# Patient Record
Sex: Female | Born: 1990 | Race: White | Hispanic: No | Marital: Married | Smoking: Never smoker
Health system: Southern US, Community
[De-identification: ages and names within clinical notes are randomized; demographics above are authoritative.]

## PROBLEM LIST (undated history)

## (undated) ENCOUNTER — Inpatient Hospital Stay: Payer: Self-pay

## (undated) DIAGNOSIS — R7689 Other specified abnormal immunological findings in serum: Secondary | ICD-10-CM

## (undated) DIAGNOSIS — B009 Herpesviral infection, unspecified: Secondary | ICD-10-CM

## (undated) DIAGNOSIS — Z800 Family history of malignant neoplasm of digestive organs: Secondary | ICD-10-CM

## (undated) DIAGNOSIS — Z9889 Other specified postprocedural states: Secondary | ICD-10-CM

## (undated) DIAGNOSIS — T8859XA Other complications of anesthesia, initial encounter: Secondary | ICD-10-CM

## (undated) DIAGNOSIS — D069 Carcinoma in situ of cervix, unspecified: Secondary | ICD-10-CM

## (undated) DIAGNOSIS — U071 COVID-19: Secondary | ICD-10-CM

## (undated) DIAGNOSIS — C801 Malignant (primary) neoplasm, unspecified: Secondary | ICD-10-CM

## (undated) DIAGNOSIS — Z8489 Family history of other specified conditions: Secondary | ICD-10-CM

## (undated) DIAGNOSIS — Z789 Other specified health status: Secondary | ICD-10-CM

## (undated) DIAGNOSIS — R112 Nausea with vomiting, unspecified: Secondary | ICD-10-CM

## (undated) DIAGNOSIS — B259 Cytomegaloviral disease, unspecified: Secondary | ICD-10-CM

## (undated) DIAGNOSIS — T4145XA Adverse effect of unspecified anesthetic, initial encounter: Secondary | ICD-10-CM

## (undated) DIAGNOSIS — R768 Other specified abnormal immunological findings in serum: Secondary | ICD-10-CM

## (undated) HISTORY — DX: Other specified abnormal immunological findings in serum: R76.89

## (undated) HISTORY — PX: TONSILLECTOMY: SUR1361

## (undated) HISTORY — DX: Herpesviral infection, unspecified: B00.9

## (undated) HISTORY — DX: Other specified abnormal immunological findings in serum: R76.8

## (undated) HISTORY — DX: Cytomegaloviral disease, unspecified: B25.9

## (undated) HISTORY — PX: APPENDECTOMY: SHX54

## (undated) HISTORY — DX: Family history of malignant neoplasm of digestive organs: Z80.0

## (undated) HISTORY — PX: ABDOMINAL HYSTERECTOMY: SHX81

## (undated) HISTORY — DX: COVID-19: U07.1

## (undated) HISTORY — DX: Carcinoma in situ of cervix, unspecified: D06.9

## (undated) HISTORY — PX: WISDOM TOOTH EXTRACTION: SHX21

## (undated) HISTORY — DX: Malignant (primary) neoplasm, unspecified: C80.1

---
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
## 2010-05-04 ENCOUNTER — Ambulatory Visit: Payer: Self-pay | Admitting: Internal Medicine

## 2011-05-18 ENCOUNTER — Inpatient Hospital Stay: Payer: Self-pay | Admitting: Obstetrics and Gynecology

## 2011-09-28 IMAGING — US ULTRASOUND LEFT BREAST
1 series · 11 of 11 positions shown · non-contrast
Comparison: none

REASON FOR EXAM: Large Firm Painful Knot Between 12 and 3 o'clock
COMMENTS:

[Series 1: ultrasound left breast · 11 of 11 slices shown]
[im 1/11]
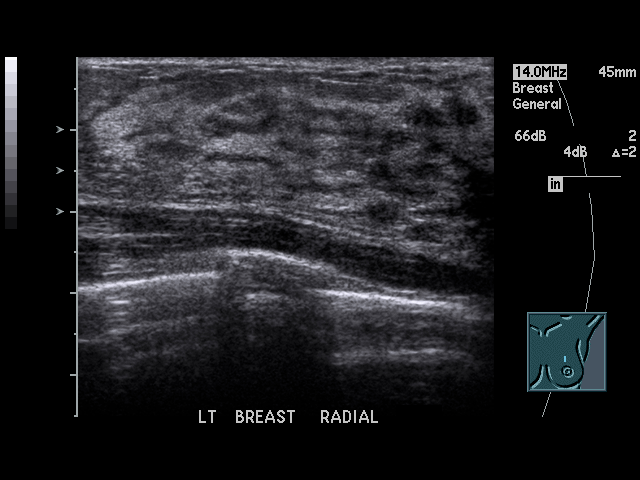
[im 2/11]
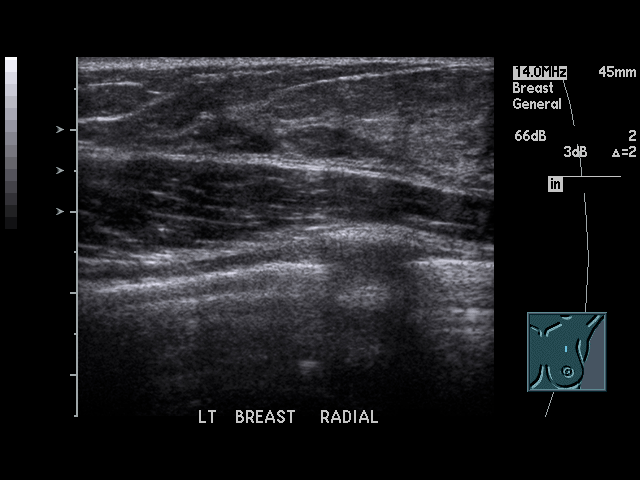
[im 3/11]
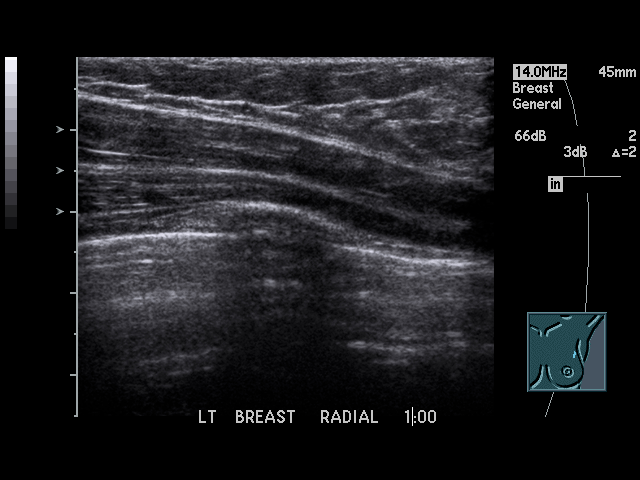
[im 4/11]
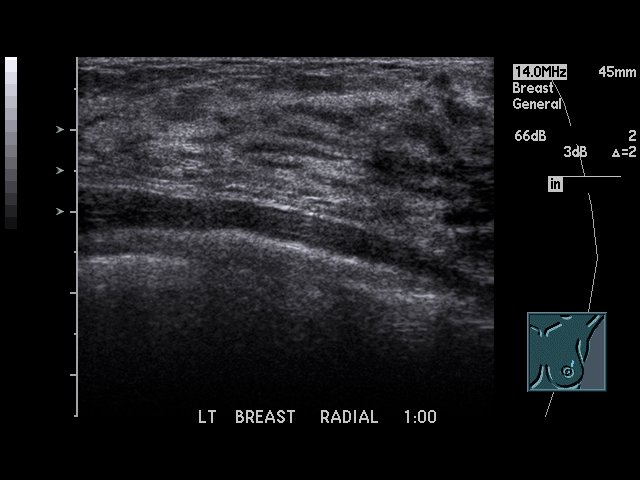
[im 5/11]
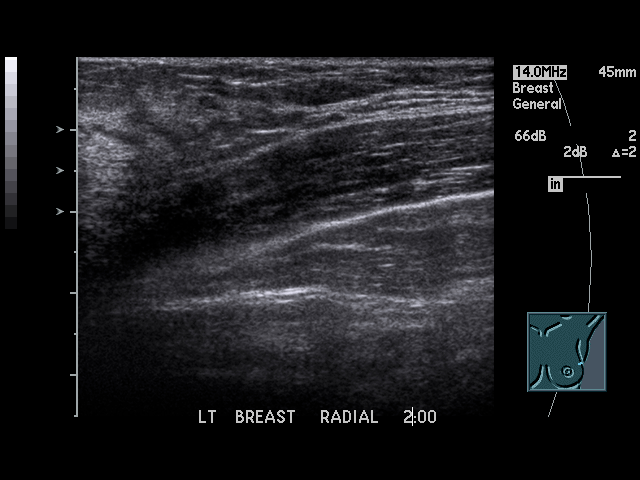
[im 6/11]
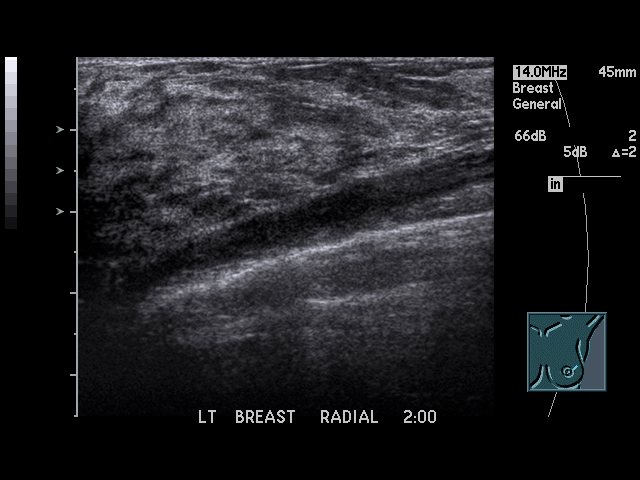
[im 7/11]
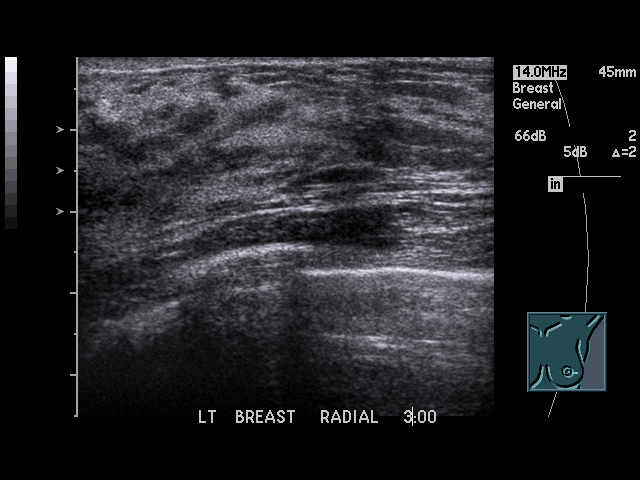
[im 8/11]
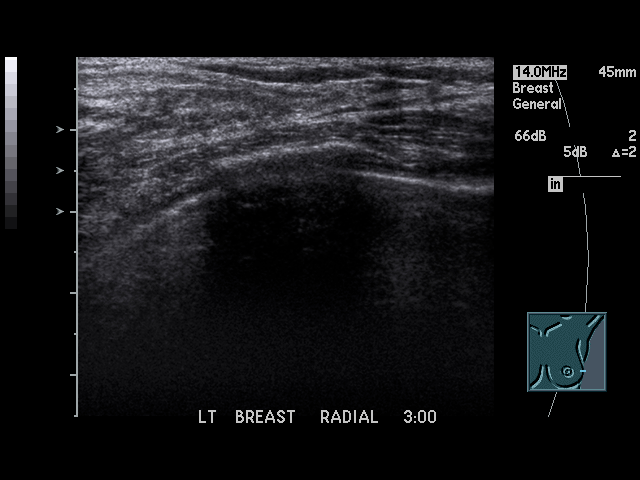
[im 9/11]
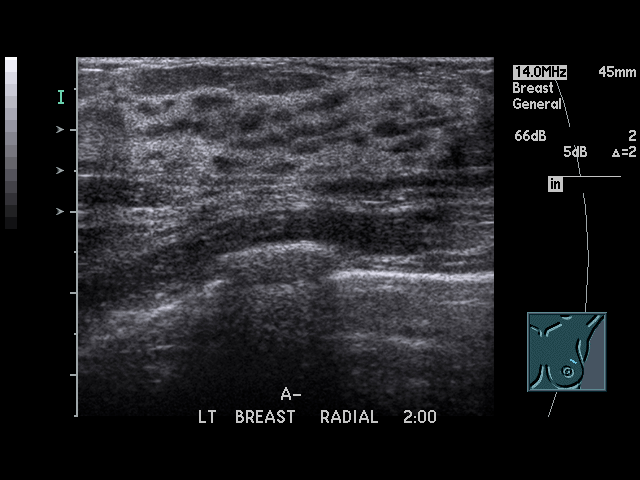
[im 10/11]
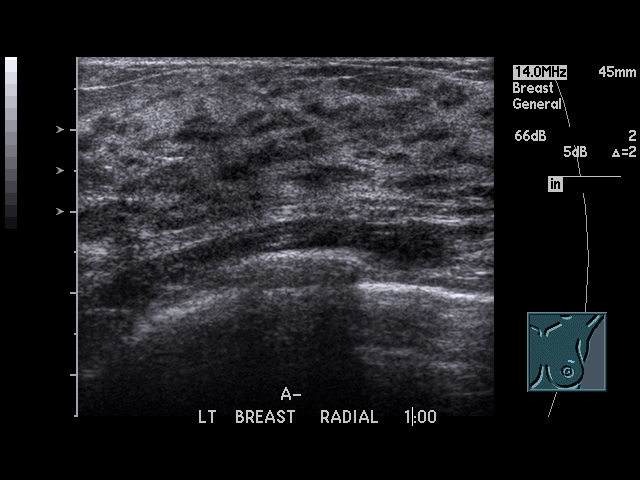
[im 11/11]
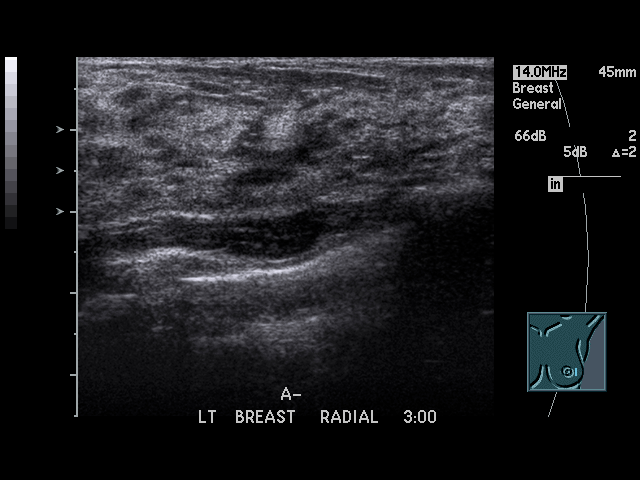

[11 of 11 positions shown; findings below may reference images not displayed]

PROCEDURE:     US  - US BREAST LEFT  - May 04, 2010  [DATE]

RESULT:     The patient has a palpable area of concern in the upper outer
quadrant of the left breast. Sonographic examination targeted to this area
shows no solid or cystic mass lesions. There is no distortion of the breast
parenchyma. Dense fibroglandular tissue is present. No skin thickening is
seen.
IMPRESSION: No mass or other sonographic abnormality is identified.

## 2015-11-06 NOTE — L&D Delivery Note (Signed)
Delivery Note Primary OB: [facility] Delivery Physician: Barnett Applebaum, MD Gestational Age: Full term Antepartum complications: none Intrapartum complications: None  A viable Female was delivered via vertex perentation.  Apgars:8 ,9  Weight:  7 lb 13 oz .   Placenta status: spontaneous and Intact.  Cord: 3+ vessels;  with the following complications: nuchal.  Anesthesia:  epidural Episiotomy:  none Lacerations:  1st Suture Repair: 2.0 vicryl Est. Blood Loss (mL):  less than 100 mL  Mom to postpartum.  Baby to Couplet care / Skin to Skin.  Barnett Applebaum, MD Dept of OB/GYN [phone]

## 2015-12-17 ENCOUNTER — Emergency Department
Admission: EM | Admit: 2015-12-17 | Discharge: 2015-12-17 | Disposition: A | Discharge status: Home / Self Care | Payer: Medicaid Other | Attending: Emergency Medicine | Admitting: Emergency Medicine

## 2015-12-17 ENCOUNTER — Encounter: Payer: Self-pay | Admitting: Emergency Medicine

## 2015-12-17 DIAGNOSIS — Z3A01 Less than 8 weeks gestation of pregnancy: Secondary | ICD-10-CM | POA: Diagnosis not present

## 2015-12-17 DIAGNOSIS — O210 Mild hyperemesis gravidarum: Secondary | ICD-10-CM | POA: Insufficient documentation

## 2015-12-17 DIAGNOSIS — O219 Vomiting of pregnancy, unspecified: Secondary | ICD-10-CM

## 2015-12-17 LAB — CBC WITH DIFFERENTIAL/PLATELET
BASOS ABS: 0 10*3/uL (ref 0–0.1)
Basophils Relative: 0 %
EOS PCT: 1 %
Eosinophils Absolute: 0 10*3/uL (ref 0–0.7)
HCT: 44.1 % (ref 35.0–47.0)
Hemoglobin: 15 g/dL (ref 12.0–16.0)
LYMPHS PCT: 28 %
Lymphs Abs: 2.3 10*3/uL (ref 1.0–3.6)
MCH: 29.4 pg (ref 26.0–34.0)
MCHC: 34.1 g/dL (ref 32.0–36.0)
MCV: 86.3 fL (ref 80.0–100.0)
MONO ABS: 0.6 10*3/uL (ref 0.2–0.9)
MONOS PCT: 8 %
Neutro Abs: 5 10*3/uL (ref 1.4–6.5)
Neutrophils Relative %: 63 %
PLATELETS: 185 10*3/uL (ref 150–440)
RBC: 5.11 MIL/uL (ref 3.80–5.20)
RDW: 13.4 % (ref 11.5–14.5)
WBC: 8 10*3/uL (ref 3.6–11.0)

## 2015-12-17 LAB — COMPREHENSIVE METABOLIC PANEL
ALT: 20 U/L (ref 14–54)
AST: 20 U/L (ref 15–41)
Albumin: 4.6 g/dL (ref 3.5–5.0)
Alkaline Phosphatase: 58 U/L (ref 38–126)
Anion gap: 11 (ref 5–15)
BILIRUBIN TOTAL: 2.3 mg/dL — AB (ref 0.3–1.2)
BUN: 9 mg/dL (ref 6–20)
CALCIUM: 9.7 mg/dL (ref 8.9–10.3)
CO2: 21 mmol/L — ABNORMAL LOW (ref 22–32)
CREATININE: 0.68 mg/dL (ref 0.44–1.00)
Chloride: 106 mmol/L (ref 101–111)
GFR calc Af Amer: >60 mL/min (ref >60)
GLUCOSE: 81 mg/dL (ref 65–99)
POTASSIUM: 3.3 mmol/L — AB (ref 3.5–5.1)
Sodium: 138 mmol/L (ref 135–145)
TOTAL PROTEIN: 7.8 g/dL (ref 6.5–8.1)

## 2015-12-17 MED ORDER — ONDANSETRON HCL 4 MG/2ML IJ SOLN
4.0000 mg | Freq: Once | INTRAMUSCULAR | Status: AC
  Administered 2015-12-17: 4 mg via INTRAVENOUS
  Filled 2015-12-17: qty 2

## 2015-12-17 MED ORDER — ONDANSETRON 4 MG PO TBDP: 4.0000 mg | ORAL_TABLET | Freq: Three times a day (TID) | ORAL | Status: DC | PRN

## 2015-12-17 MED ORDER — SODIUM CHLORIDE 0.9 % IV SOLN
INTRAVENOUS | Status: DC
  Administered 2015-12-17: 11:00:00 via INTRAVENOUS

## 2015-12-17 NOTE — ED Provider Notes (Signed)
[facility] Emergency Department Provider Note  Time seen: 11:49 AM  I have reviewed the triage vital signs and the nursing notes.   HISTORY  Chief Complaint Emesis    HPI Stefanie Gomez is a 25 y.o. female G2 P1, personally [redacted] weeks pregnant who presents the emergency department nausea or vomiting. According to the patient for the past one week she has been nauseated, with vomiting most days. Denies any significant morning sickness with her first pregnancy. Denies any abdominal pain, vaginal bleeding or discharge. States her nausea is minimal at this time.     History reviewed. No pertinent past medical history.  There are no active problems to display for this patient.   Past Surgical History  Procedure Laterality Date  . Appendectomy      No current outpatient prescriptions on file.  Allergies Bactericin  History reviewed. No pertinent family history.  Social History Social History  Substance Use Topics  . Smoking status: Never Smoker   . Smokeless tobacco: None  . Alcohol Use: None    Review of Systems Constitutional: Negative for fever. Cardiovascular: Negative for chest pain. Respiratory: Negative for shortness of breath. Gastrointestinal: Negative for abdominal pain. Positive nausea and vomiting. Negative for diarrhea. Genitourinary: Negative for dysuria. Neurological: Negative for headache 10-point ROS otherwise negative.  ____________________________________________   PHYSICAL EXAM:  VITAL SIGNS: ED Triage Vitals  Enc Vitals Group     BP 12/17/15 0840 140/85 mmHg     Pulse Rate 12/17/15 0840 108     Resp 12/17/15 0840 18     Temp 12/17/15 0840 97.5 F (36.4 C)     Temp Source 12/17/15 0840 Oral     SpO2 12/17/15 0840 100 %     Weight 12/17/15 0840 163 lb (73.936 kg)     Height 12/17/15 0840 5\' 8"  (1.727 m)     Head Cir --      Peak Flow --      Pain Score 12/17/15 0841 1     Pain Loc --      Pain Edu? --    Excl. in [place]? --    Constitutional: Alert and oriented. Well appearing and in no distress. Eyes: Normal exam ENT   Head: Normocephalic and atraumatic   Mouth/Throat: Mucous membranes are moist. Cardiovascular: Normal rate, regular rhythm. No murmur Respiratory: Normal respiratory effort without tachypnea nor retractions. Breath sounds are clear Gastrointestinal: Soft and nontender. No distention.   Musculoskeletal: Nontender with normal range of motion in all extremities.  Neurologic:  Normal speech and language. No gross focal neurologic deficits Skin:  Skin is warm, dry and intact.  Psychiatric: Mood and affect are normal. Speech and behavior are normal.   ____________________________________________   INITIAL IMPRESSION / ASSESSMENT AND PLAN / ED COURSE  Pertinent labs & imaging results that were available during my care of the patient were reviewed by me and considered in my medical decision making (see chart for details).  Patient presents the emergency department nausea and vomiting, [redacted] weeks pregnant. Has her first OB appointment next week. Patient overall appears well. States she is feeling much better after IV fluids. I discussed at length the pros and cons of Zofran, given the possible cleft lip, congenital abnormality defects. They stated they would like to talk it over among themselves, and let me know what they've decided. She states she is feeling better, given her mild dehydration on labs and anion gap of 11 I will dose 1 additional liter of  IV fluids, continue to monitor in the emergency department.  Patient wishes to proceed with Zofran. We will discharge on Zofran ODT. Patient continues to feel well in the emergency department is asking for something to drink.  ____________________________________________   FINAL CLINICAL IMPRESSION(S) / ED DIAGNOSES  Nausea and vomiting of pregnancy   Harvest Dark, MD 12/17/15 1253

## 2015-12-17 NOTE — ED Notes (Signed)
[redacted] wks pregnant, vomiting.

## 2015-12-17 NOTE — Discharge Instructions (Signed)

## 2016-07-07 ENCOUNTER — Observation Stay
Admission: EM | Admit: 2016-07-07 | Discharge: 2016-07-07 | Disposition: A | Discharge status: Home / Self Care | Payer: Medicaid Other | Attending: Obstetrics and Gynecology | Admitting: Obstetrics and Gynecology

## 2016-07-07 DIAGNOSIS — Z3493 Encounter for supervision of normal pregnancy, unspecified, third trimester: Principal | ICD-10-CM | POA: Insufficient documentation

## 2016-07-07 DIAGNOSIS — O4290 Premature rupture of membranes, unspecified as to length of time between rupture and onset of labor, unspecified weeks of gestation: Secondary | ICD-10-CM | POA: Diagnosis present

## 2016-07-07 NOTE — Final Progress Note (Signed)
Physician Final Progress Note  Patient ID: Stefanie Gomez MRN: [id number] DOB/AGE: [birth year] 25 y.o.  Admit date: 07/07/2016 Admitting provider: Malachy Mood, MD Discharge date: 07/07/2016   Admission Diagnoses: Leaking fluid  Discharge Diagnoses:  No evidence of ruptured membranes  Consults: none  Significant Findings/ Diagnostic Studies: negative ferning and nitrazine, reactive NST  Procedures: none  Discharge Condition: stable  Disposition: 01-Home or Self Care  Diet: general  Discharge Activity: no restrictions  Discharge Instructions    Discharge activity:  No Restrictions    Complete by:  As directed   Fetal Kick Count:  Lie on our left side for one hour after a meal, and count the number of times your baby kicks.  If it is less than 5 times, get up, move around and drink some juice.  Repeat the test 30 minutes later.  If it is still less than 5 kicks in an hour, notify your doctor.    Complete by:  As directed   LABOR:  When conractions begin, you should start to time them from the beginning of one contraction to the beginning  of the next.  When contractions are 5 - 10 minutes apart or less and have been regular for at least an hour, you should call your health care provider.    Complete by:  As directed   No sexual activity restrictions    Complete by:  As directed   Notify physician for bleeding from the vagina    Complete by:  As directed   Notify physician for blurring of vision or spots before the eyes    Complete by:  As directed   Notify physician for chills or fever    Complete by:  As directed   Notify physician for fainting spells, "black outs" or loss of consciousness    Complete by:  As directed   Notify physician for increase in vaginal discharge    Complete by:  As directed   Notify physician for leaking of fluid    Complete by:  As directed   Notify physician for pain or burning when urinating    Complete by:  As directed   Notify physician for pelvic  pressure (sudden increase)    Complete by:  As directed   Notify physician for severe or continued nausea or vomiting    Complete by:  As directed   Notify physician for sudden gushing of fluid from the vagina (with or without continued leaking)    Complete by:  As directed   Notify physician for sudden, constant, or occasional abdominal pain    Complete by:  As directed   Notify physician if baby moving less than usual    Complete by:  As directed       Medication List    STOP taking these medications   ondansetron 4 MG disintegrating tablet Commonly known as:  ZOFRAN ODT     TAKE these medications   prenatal multivitamin Tabs tablet Take 1 tablet by mouth daily at 12 noon.        Total time spent taking care of this patient: 10 minutes  Signed: Dorthula Nettles 07/07/2016, 2:58 AM

## 2016-07-07 NOTE — OB Triage Note (Signed)
Ms. Stefanie Gomez came in tonight reporting that her water broke around 12:30am . Reports clear fluid and postive fetal movement. Denies vaginal bleeding.

## 2016-07-07 NOTE — Discharge Instructions (Signed)
Keep prenatal appointment this Wednesday, 07/11/16.

## 2016-08-03 ENCOUNTER — Inpatient Hospital Stay: Payer: Medicaid Other | Admitting: Anesthesiology

## 2016-08-03 ENCOUNTER — Inpatient Hospital Stay
Admission: EM | Admit: 2016-08-03 | Discharge: 2016-08-05 | DRG: 775 | Disposition: A | Discharge status: Home / Self Care | Payer: Medicaid Other | Attending: Obstetrics & Gynecology | Admitting: Obstetrics & Gynecology

## 2016-08-03 DIAGNOSIS — D62 Acute posthemorrhagic anemia: Secondary | ICD-10-CM | POA: Diagnosis not present

## 2016-08-03 DIAGNOSIS — Z3A39 39 weeks gestation of pregnancy: Secondary | ICD-10-CM

## 2016-08-03 DIAGNOSIS — Z3483 Encounter for supervision of other normal pregnancy, third trimester: Secondary | ICD-10-CM | POA: Diagnosis present

## 2016-08-03 DIAGNOSIS — O9081 Anemia of the puerperium: Secondary | ICD-10-CM | POA: Diagnosis not present

## 2016-08-03 LAB — TYPE AND SCREEN
ABO/RH(D): O POS
ANTIBODY SCREEN: NEGATIVE

## 2016-08-03 LAB — CBC
HCT: 36.8 % (ref 35.0–47.0)
HEMOGLOBIN: 12.2 g/dL (ref 12.0–16.0)
MCH: 26.6 pg (ref 26.0–34.0)
MCHC: 33.1 g/dL (ref 32.0–36.0)
MCV: 80.4 fL (ref 80.0–100.0)
Platelets: 160 10*3/uL (ref 150–440)
RBC: 4.57 MIL/uL (ref 3.80–5.20)
RDW: 14.5 % (ref 11.5–14.5)
WBC: 9.4 10*3/uL (ref 3.6–11.0)

## 2016-08-03 MED ORDER — BENZOCAINE-MENTHOL 20-0.5 % EX AERO: 1.0000 "application " | INHALATION_SPRAY | CUTANEOUS | Status: DC | PRN

## 2016-08-03 MED ORDER — LIDOCAINE HCL (PF) 1 % IJ SOLN
INTRAMUSCULAR | Status: AC
  Filled 2016-08-03: qty 30

## 2016-08-03 MED ORDER — FENTANYL 2.5 MCG/ML W/ROPIVACAINE 0.2% IN NS 100 ML EPIDURAL INFUSION (ARMC-ANES)
EPIDURAL | Status: DC | PRN
  Administered 2016-08-03: 10 mL/h via EPIDURAL

## 2016-08-03 MED ORDER — ONDANSETRON HCL 4 MG/2ML IJ SOLN: 4.0000 mg | Freq: Four times a day (QID) | INTRAMUSCULAR | Status: DC | PRN

## 2016-08-03 MED ORDER — SIMETHICONE 80 MG PO CHEW: 80.0000 mg | CHEWABLE_TABLET | ORAL | Status: DC | PRN

## 2016-08-03 MED ORDER — ONDANSETRON HCL 4 MG/2ML IJ SOLN
4.0000 mg | INTRAMUSCULAR | Status: DC | PRN
Start: 2016-08-03 — End: 2016-08-05

## 2016-08-03 MED ORDER — AMMONIA AROMATIC IN INHA
RESPIRATORY_TRACT | Status: AC
  Filled 2016-08-03: qty 10

## 2016-08-03 MED ORDER — BUTORPHANOL TARTRATE 1 MG/ML IJ SOLN
1.0000 mg | INTRAMUSCULAR | Status: DC | PRN
  Administered 2016-08-03 (×2): 1 mg via INTRAVENOUS
  Filled 2016-08-03 (×2): qty 1

## 2016-08-03 MED ORDER — ACETAMINOPHEN 325 MG PO TABS: 650.0000 mg | ORAL_TABLET | ORAL | Status: DC | PRN

## 2016-08-03 MED ORDER — DIPHENHYDRAMINE HCL 25 MG PO CAPS: 25.0000 mg | ORAL_CAPSULE | Freq: Four times a day (QID) | ORAL | Status: DC | PRN

## 2016-08-03 MED ORDER — SODIUM CHLORIDE 0.9% FLUSH: 3.0000 mL | INTRAVENOUS | Status: DC | PRN

## 2016-08-03 MED ORDER — MISOPROSTOL 200 MCG PO TABS
ORAL_TABLET | ORAL | Status: AC
  Filled 2016-08-03: qty 4

## 2016-08-03 MED ORDER — OXYTOCIN 40 UNITS IN LACTATED RINGERS INFUSION - SIMPLE MED
INTRAVENOUS | Status: AC
  Filled 2016-08-03: qty 1000

## 2016-08-03 MED ORDER — OXYCODONE-ACETAMINOPHEN 5-325 MG PO TABS: 1.0000 | ORAL_TABLET | ORAL | Status: DC | PRN

## 2016-08-03 MED ORDER — LIDOCAINE-EPINEPHRINE (PF) 1.5 %-1:200000 IJ SOLN
INTRAMUSCULAR | Status: DC | PRN
  Administered 2016-08-03: 3 mL via PERINEURAL

## 2016-08-03 MED ORDER — SODIUM CHLORIDE 0.9% FLUSH: 3.0000 mL | Freq: Two times a day (BID) | INTRAVENOUS | Status: DC

## 2016-08-03 MED ORDER — SENNOSIDES-DOCUSATE SODIUM 8.6-50 MG PO TABS
2.0000 | ORAL_TABLET | ORAL | Status: DC
  Administered 2016-08-04: 2 via ORAL
  Filled 2016-08-03: qty 2

## 2016-08-03 MED ORDER — WITCH HAZEL-GLYCERIN EX PADS: 1.0000 "application " | MEDICATED_PAD | CUTANEOUS | Status: DC | PRN

## 2016-08-03 MED ORDER — IBUPROFEN 600 MG PO TABS
600.0000 mg | ORAL_TABLET | Freq: Four times a day (QID) | ORAL | Status: DC
  Administered 2016-08-03 – 2016-08-05 (×6): 600 mg via ORAL
  Filled 2016-08-03 (×7): qty 1

## 2016-08-03 MED ORDER — LIDOCAINE HCL (PF) 1 % IJ SOLN
INTRAMUSCULAR | Status: DC | PRN
  Administered 2016-08-03: 3 mL

## 2016-08-03 MED ORDER — LACTATED RINGERS IV SOLN
INTRAVENOUS | Status: DC
  Administered 2016-08-03: 12:00:00 via INTRAVENOUS

## 2016-08-03 MED ORDER — OXYTOCIN 10 UNIT/ML IJ SOLN
INTRAMUSCULAR | Status: AC
  Filled 2016-08-03: qty 2

## 2016-08-03 MED ORDER — ZOLPIDEM TARTRATE 5 MG PO TABS: 5.0000 mg | ORAL_TABLET | Freq: Every evening | ORAL | Status: DC | PRN

## 2016-08-03 MED ORDER — OXYCODONE-ACETAMINOPHEN 5-325 MG PO TABS: 2.0000 | ORAL_TABLET | ORAL | Status: DC | PRN

## 2016-08-03 MED ORDER — FENTANYL 2.5 MCG/ML W/ROPIVACAINE 0.2% IN NS 100 ML EPIDURAL INFUSION (ARMC-ANES)
EPIDURAL | Status: AC
  Filled 2016-08-03: qty 100

## 2016-08-03 MED ORDER — BUPIVACAINE HCL (PF) 0.25 % IJ SOLN
INTRAMUSCULAR | Status: DC | PRN
  Administered 2016-08-03 (×2): 5 mL via EPIDURAL

## 2016-08-03 MED ORDER — LACTATED RINGERS IV SOLN: 500.0000 mL | INTRAVENOUS | Status: DC | PRN

## 2016-08-03 MED ORDER — SODIUM CHLORIDE 0.9 % IV SOLN
250.0000 mL | INTRAVENOUS | Status: DC | PRN
  Administered 2016-08-03: 250 mL via INTRAVENOUS

## 2016-08-03 MED ORDER — OXYTOCIN 40 UNITS IN LACTATED RINGERS INFUSION - SIMPLE MED: 2.5000 [IU]/h | INTRAVENOUS | Status: DC

## 2016-08-03 MED ORDER — ONDANSETRON HCL 4 MG PO TABS: 4.0000 mg | ORAL_TABLET | ORAL | Status: DC | PRN

## 2016-08-03 MED ORDER — DIBUCAINE 1 % RE OINT: 1.0000 "application " | TOPICAL_OINTMENT | RECTAL | Status: DC | PRN

## 2016-08-03 MED ORDER — COCONUT OIL OIL: 1.0000 "application " | TOPICAL_OIL | Status: DC | PRN

## 2016-08-03 MED ORDER — OXYTOCIN BOLUS FROM INFUSION: 500.0000 mL | Freq: Once | INTRAVENOUS | Status: DC

## 2016-08-03 NOTE — H&P (Signed)
Obstetrics Admission History & Physical   CC: Labor contractions   HPI:  25 y.o. G2P1001 @ [redacted]w[redacted]d (08/04/2016, by Last Menstrual Period). Admitted on 08/03/2016: Presents for pain this am worsening, No VB or ROM. Good FM.  No complications this pregnancy.  Pren[facility] care at: at [facility]  PMHx: No past medical history on file. PSHx:  Past Surgical History:  Procedure Laterality Date  . APPENDECTOMY     Medications:  Prescriptions Prior to Admission  Medication Sig Dispense Refill Last Dose  . Prenatal Vit-Fe Fumarate-FA (PRENATAL MULTIVITAMIN) TABS tablet Take 1 tablet by mouth daily at 12 noon.   08/03/2016   Allergies: is allergic to bactericin [bacitracin]. OBHx:  OB History  Gravida Para Term Preterm AB Living  2 1 1     1   SAB TAB Ectopic Multiple Live Births          1    # Outcome Date GA Lbr Len/2nd Weight Sex Delivery Anes PTL Lv  2 Current           1 Term 05/19/11 [redacted]w[redacted]d   F Vag-Spont   LIV     [id number] except as detailed in HPI. Soc Hx: Pregnancy welcomed  Objective:  There were no vitals filed for this visit. General: Well nourished, well developed female in no acute distress.  Skin: Warm and dry.  Cardiovascular:Regular rate and rhythm. Respiratory: Clear to auscultation bilateral. Normal respiratory effort Abdomen: moderate Neuro/Psych: Normal mood and affect.   Pelvic exam: is not limited by body habitus EGBUS: within normal limits Vagina: within normal limits and with normal mucosa blood in the vault Cervix: 4/80/-2 Uterus: Spontaneous uterine activity  Adnexa: not evaluated  EFM:FHR: 140 bpm, variability: moderate,  accelerations:  Present,  decelerations:  Absent Toco: Frequency: Every 3-4 minutes   Perinatal info:  Blood type: O positive Rubella- Immune Varicella -Immune TDaP Given during third trimester of this pregnancy RPR NR / HIV Neg/ HBsAg Neg  GBS Neg  Assessment & Plan:   25 y.o. G2P1001 @ [redacted]w[redacted]d, Admitted on  08/03/2016: Labor    Admit for labor, Fetal Wellbeing Reassuring, Epidural when ready and AROM when Appropriate   Plans PP BTL

## 2016-08-03 NOTE — Anesthesia Procedure Notes (Signed)
Epidural Patient location during procedure: OB Start time: 08/03/2016 1:05 PM End time: 08/03/2016 1:24 PM  Staffing Resident/CRNA: Nelda Marseille Performed: resident/CRNA   Preanesthetic Checklist Completed: patient identified, site marked, surgical consent, pre-op evaluation, timeout performed, IV checked, risks and benefits discussed and monitors and equipment checked  Epidural Patient position: sitting Prep: Betadine Patient monitoring: heart rate, continuous pulse ox and blood pressure Approach: midline Location: L3-L4 Injection technique: LOR saline  Needle:  Needle type: Tuohy  Needle gauge: 18 G Needle length: 9 cm and 9 Catheter type: closed end flexible Catheter size: 20 Guage Test dose: negative and 1.5% lidocaine with Epi 1:200 K  Assessment Events: blood not aspirated, injection not painful, no injection resistance, negative IV test and no paresthesia  Additional Notes   Patient tolerated the insertion well without complications.Reason for block:procedure for pain

## 2016-08-03 NOTE — Anesthesia Preprocedure Evaluation (Addendum)
Anesthesia Evaluation  Patient identified by MRN, date of birth, ID band Patient awake    Reviewed: Allergy & Precautions, NPO status , Patient's Chart, lab work & pertinent test results  Airway Mallampati: II  TM Distance: <3 FB Neck ROM: full    Dental   Pulmonary neg pulmonary ROS,           Cardiovascular negative cardio ROS       Neuro/Psych negative neurological ROS  negative psych ROS   GI/Hepatic negative GI ROS, Neg liver ROS,   Endo/Other  negative endocrine ROS  Renal/GU negative Renal ROS  negative genitourinary   Musculoskeletal negative musculoskeletal ROS (+)   Abdominal   Peds negative pediatric ROS (+)  Hematology negative hematology ROS (+)   Anesthesia Other Findings   Reproductive/Obstetrics negative OB ROS                            Anesthesia Physical Anesthesia Plan  ASA: II  Anesthesia Plan: Epidural   Post-op Pain Management:    Induction:   Airway Management Planned:   Additional Equipment:   Intra-op Plan:   Post-operative Plan:   Informed Consent: I have reviewed the patients History and Physical, chart, labs and discussed the procedure including the risks, benefits and alternatives for the proposed anesthesia with the patient or authorized representative who has indicated his/her understanding and acceptance.     Plan Discussed with: CRNA and Anesthesiologist  Anesthesia Plan Comments:         Anesthesia Quick Evaluation

## 2016-08-03 NOTE — Plan of Care (Signed)
Pt assisted up to bedside commode. Voided. pericare given. Transferred to MB floor via wheelchair by susan hedrick,rn

## 2016-08-03 NOTE — Discharge Instructions (Signed)

## 2016-08-03 NOTE — Progress Notes (Signed)
Pt unable to lift left leg off bed. Able to move right leg but still unsteady.

## 2016-08-03 NOTE — Discharge Summary (Signed)
Obstetrical Discharge Summary  Date of Admission: 08/03/2016 Date of Discharge:08/04/16  Discharge Diagnosis: Term Pregnancy-delivered Primary OB:  [facility]   Gestational Age at Delivery: [redacted]w[redacted]d  Antepartum complications: none Date of Delivery: 08/04/2016  Delivered By: Barnett Applebaum, MD Delivery Type: spontaneous vaginal delivery Intrapartum complications/course: None Anesthesia: epidural Placenta: spontaneous Laceration: 1st degree Episiotomy: none Live born Female Birth Weight: 7-13 lbs APGAR: 8, 9   Post partum course: Since the delivery, patient has tolerate activity, diet, and daily functions without difficulty or complication.  Min lochia.  No breast concerns at this time.  No signs of depression currently.   Postpartum Exam:General appearance: alert, appears stated age and no distress GI: soft, non-tender; bowel sounds normal; no masses,  no organomegaly and Fundus firm Extremities: extremities normal, atraumatic, no cyanosis or edema  Disposition: home with infant Rh Immune globulin given: not applicable Rubella vaccine given: no Varicella vaccine given: no Tdap vaccine given in AP or PP setting: given during prenatal care Flu vaccine given in AP or PP setting: given during prenatal care Contraception: bilateral tubal ligation (interval) the patient has a history of prior ruptured appendicitis that required a McBurney's incision so risk of adhesive disease  Prenatal Labs: O POS//Rubella Immune//RPR negative//HIV negative/HepB Surface Ag negative//plans to bottle feed  Plan:  Gwenneth Jarnagin was discharged to home in good condition. Follow-up appointment with [facility] provider in 6 weeks  Discharge Medications:   Medication List    TAKE these medications   ibuprofen 600 MG tablet Commonly known as:  ADVIL,MOTRIN Take 1 tablet (600 mg total) by mouth every 6 (six) hours.   prenatal multivitamin Tabs tablet Take 1 tablet by mouth daily at 12 noon.       Follow-up  arrangements:  4 week Deaken Jurgens  No future appointments.

## 2016-08-04 ENCOUNTER — Encounter: Payer: Self-pay | Admitting: Anesthesiology

## 2016-08-04 ENCOUNTER — Encounter
Admission: EM | Disposition: A | Discharge status: Home / Self Care | Payer: Self-pay | Source: Home / Self Care | Attending: Obstetrics & Gynecology

## 2016-08-04 LAB — CBC
HEMATOCRIT: 30.2 % — AB (ref 35.0–47.0)
HEMOGLOBIN: 10.4 g/dL — AB (ref 12.0–16.0)
MCH: 27.2 pg (ref 26.0–34.0)
MCHC: 34.4 g/dL (ref 32.0–36.0)
MCV: 79.2 fL — AB (ref 80.0–100.0)
Platelets: 132 10*3/uL — ABNORMAL LOW (ref 150–440)
RBC: 3.81 MIL/uL (ref 3.80–5.20)
RDW: 14.2 % (ref 11.5–14.5)
WBC: 8.8 10*3/uL (ref 3.6–11.0)

## 2016-08-04 LAB — RPR: RPR: NONREACTIVE

## 2016-08-04 SURGERY — LIGATION, FALLOPIAN TUBE, POSTPARTUM
Anesthesia: Choice

## 2016-08-04 MED ORDER — IBUPROFEN 600 MG PO TABS: 600.0000 mg | ORAL_TABLET | Freq: Four times a day (QID) | ORAL | 0 refills | Status: DC

## 2016-08-04 SURGICAL SUPPLY — 25 items
CHLORAPREP W/TINT 26ML (MISCELLANEOUS) ×3 IMPLANT
DRAPE LAPAROTOMY 100X77 ABD (DRAPES) ×3 IMPLANT
DRESSING TELFA 4X3 1S ST N-ADH (GAUZE/BANDAGES/DRESSINGS) ×3 IMPLANT
DRSG TEGADERM 2-3/8X2-3/4 SM (GAUZE/BANDAGES/DRESSINGS) ×3 IMPLANT
ELECT REM PT RETURN 9FT ADLT (ELECTROSURGICAL) ×3
ELECTRODE REM PT RTRN 9FT ADLT (ELECTROSURGICAL) ×1 IMPLANT
GLOVE BIO SURGEON STRL SZ7 (GLOVE) ×3 IMPLANT
GLOVE INDICATOR 7.5 STRL GRN (GLOVE) ×3 IMPLANT
GOWN STRL REUS W/ TWL LRG LVL3 (GOWN DISPOSABLE) ×2 IMPLANT
GOWN STRL REUS W/TWL LRG LVL3 (GOWN DISPOSABLE) ×4
LABEL OR SOLS (LABEL) ×3 IMPLANT
LIQUID BAND (GAUZE/BANDAGES/DRESSINGS) ×3 IMPLANT
NDL SAFETY 22GX1.5 (NEEDLE) ×3 IMPLANT
NS IRRIG 500ML POUR BTL (IV SOLUTION) ×3 IMPLANT
PACK BASIN MINOR ARMC (MISCELLANEOUS) ×3 IMPLANT
SPONGE LAP 4X18 5PK (MISCELLANEOUS) ×3 IMPLANT
STRAP SAFETY BODY (MISCELLANEOUS) ×3 IMPLANT
SUT CHROMIC GUT BROWN 0 54 (SUTURE) ×1 IMPLANT
SUT CHROMIC GUT BROWN 0 54IN (SUTURE) ×3
SUT MNCRL 4-0 (SUTURE) ×2
SUT MNCRL 4-0 27XMFL (SUTURE) ×1
SUT VIC AB 0 UR5 27 (SUTURE) ×3 IMPLANT
SUT VIC AB 2-0 UR6 27 (SUTURE) ×6 IMPLANT
SUTURE MNCRL 4-0 27XMF (SUTURE) ×1 IMPLANT
SYRINGE 10CC LL (SYRINGE) ×3 IMPLANT

## 2016-08-04 NOTE — Anesthesia Postprocedure Evaluation (Signed)
Anesthesia Post Note  Patient: Stefanie Gomez  Procedure(s) Performed: * No procedures listed *  Patient location during evaluation: Mother Baby Anesthesia Type: Epidural Level of consciousness: awake and alert Pain management: pain level controlled Vital Signs Assessment: post-procedure vital signs reviewed and stable Respiratory status: spontaneous breathing, nonlabored ventilation and respiratory function stable Cardiovascular status: stable Postop Assessment: no headache, no backache and epidural receding Anesthetic complications: no    Last Vitals:  Vitals:   08/04/16 0423 08/04/16 0800  BP: 104/65 114/69  Pulse: 73 (!) 58  Resp: 18 16  Temp: 36.6 C 36.9 C    Last Pain:  Vitals:   08/04/16 0800  TempSrc: Oral  PainSc: 1                  Wayne Wicklund

## 2016-08-05 MED ORDER — INFLUENZA VAC SPLIT QUAD 0.5 ML IM SUSY: 0.5000 mL | PREFILLED_SYRINGE | INTRAMUSCULAR | Status: DC

## 2016-08-05 MED ORDER — INFLUENZA VAC SPLIT QUAD 0.5 ML IM SUSY
0.5000 mL | PREFILLED_SYRINGE | Freq: Once | INTRAMUSCULAR | Status: AC
  Administered 2016-08-05: 0.5 mL via INTRAMUSCULAR

## 2016-08-05 MED ORDER — INFLUENZA VAC SPLIT QUAD 0.5 ML IM SUSY
PREFILLED_SYRINGE | INTRAMUSCULAR | Status: AC
  Filled 2016-08-05: qty 0.5

## 2016-08-05 NOTE — Progress Notes (Signed)
  Subjective:  Doing well no concerns Objective:   Blood pressure 114/75, pulse (!) 50, temperature 97.8 F (36.6 C), temperature source Oral, resp. rate 17, last menstrual period 10/29/2015, SpO2 99 %, unknown if currently breastfeeding.  General: NAD Pulmonary: no increased work of breathing Abdomen: non-distended, non-tender, fundus firm at level of umbilicus Extremities: no edema, no erythema, no tenderness  Results for orders placed or performed during the hospital encounter of 08/03/16 (from the past 72 hour(s))  Type and screen [place]     Status: None   Collection Time: 08/03/16 10:30 AM  Result Value Ref Range   ABO/RH(D) O POS    Antibody Screen NEG    Sample Expiration 08/06/2016   CBC     Status: None   Collection Time: 08/03/16 10:31 AM  Result Value Ref Range   WBC 9.4 3.6 - 11.0 K/uL   RBC 4.57 3.80 - 5.20 MIL/uL   Hemoglobin 12.2 12.0 - 16.0 g/dL   HCT 36.8 35.0 - 47.0 %   MCV 80.4 80.0 - 100.0 fL   MCH 26.6 26.0 - 34.0 pg   MCHC 33.1 32.0 - 36.0 g/dL   RDW 14.5 11.5 - 14.5 %   Platelets 160 150 - 440 K/uL  RPR     Status: None   Collection Time: 08/03/16 10:31 AM  Result Value Ref Range   RPR Ser Ql Non Reactive Non Reactive    Comment: (NOTE) Performed At: [facility] [place], [place] [id number] Lindon Romp MD [id number]   CBC     Status: Abnormal   Collection Time: 08/04/16  6:02 AM  Result Value Ref Range   WBC 8.8 3.6 - 11.0 K/uL   RBC 3.81 3.80 - 5.20 MIL/uL   Hemoglobin 10.4 (L) 12.0 - 16.0 g/dL   HCT 30.2 (L) 35.0 - 47.0 %   MCV 79.2 (L) 80.0 - 100.0 fL   MCH 27.2 26.0 - 34.0 pg   MCHC 34.4 32.0 - 36.0 g/dL   RDW 14.2 11.5 - 14.5 %   Platelets 132 (L) 150 - 440 K/uL    Assessment:   25 y.o. [id number] postpartum day #2 TSVD  Plan:    1) Acute blood loss anemia - hemodynamically stable and asymptomatic - po ferrous sulfate  2) --/--/O POS (09/29 1030) / Rubella Immune/ Varicella  Immune  3) TDAP status UTD  4) Breast/Interval BTL  5) Disposition home

## 2016-08-05 NOTE — Progress Notes (Signed)
Discharged to home via wheelchair.

## 2016-08-05 NOTE — Progress Notes (Signed)
Discharge instr reviewed with pt. Verb u/o

## 2016-09-05 DIAGNOSIS — Z9889 Other specified postprocedural states: Secondary | ICD-10-CM

## 2016-09-05 DIAGNOSIS — R112 Nausea with vomiting, unspecified: Secondary | ICD-10-CM

## 2016-09-05 HISTORY — DX: Other specified postprocedural states: Z98.890

## 2016-09-05 HISTORY — DX: Nausea with vomiting, unspecified: R11.2

## 2016-09-05 HISTORY — PX: TUBAL LIGATION: SHX77

## 2016-09-18 ENCOUNTER — Encounter
Admission: RE | Admit: 2016-09-18 | Discharge: 2016-09-18 | Disposition: A | Discharge status: Home / Self Care | Payer: Medicaid Other | Source: Ambulatory Visit | Attending: Obstetrics and Gynecology | Admitting: Obstetrics and Gynecology

## 2016-09-18 HISTORY — DX: Family history of other specified conditions: Z84.89

## 2016-09-18 NOTE — Patient Instructions (Signed)
  Your procedure is scheduled on: 09-25-16 (TUESDAY) Report to Same Day Surgery 2nd floor medical mall To find out your arrival time please call [phone] between [place] on 09-24-16 [facility])  Remember: Instructions that are not followed completely may result in serious medical risk, up to and including death, or upon the discretion of your surgeon and anesthesiologist your surgery may need to be rescheduled.    _x___ 1. Do not eat food or drink liquids after midnight. No gum chewing or hard candies.     __x__ 2. No Alcohol for 24 hours before or after surgery.   __x__3. No Smoking for 24 prior to surgery.   ____  4. Bring all medications with you on the day of surgery if instructed.    __x__ 5. Notify your doctor if there is any change in your medical condition     (cold, fever, infections).     Do not wear jewelry, make-up, hairpins, clips or nail polish.  Do not wear lotions, powders, or perfumes. You may wear deodorant.  Do not shave 48 hours prior to surgery. Men may shave face and neck.  Do not bring valuables to the hospital.    [facility] is not responsible for any belongings or valuables.               Contacts, dentures or bridgework may not be worn into surgery.  Leave your suitcase in the car. After surgery it may be brought to your room.  For patients admitted to the hospital, discharge time is determined by your treatment team.   Patients discharged the day of surgery will not be allowed to drive home.    Please read over the following fact sheets that you were given:   [facility] Preparing for Surgery and or MRSA Information   ____ Take these medicines the morning of surgery with A SIP OF WATER:    1. NONE  2.  3.  4.  5.  6.  ____Fleets enema or Magnesium Citrate as directed.   _x___ Use CHG Soap or sage wipes as directed on instruction sheet   ____ Use inhalers on the day of surgery and bring to hospital day of surgery  ____ Stop metformin 2  days prior to surgery    ____ Take 1/2 of usual insulin dose the night before surgery and none on the morning of surgery.   ____ Stop aspirin or coumadin, or plavix  x__ Stop Anti-inflammatories such as Advil, Aleve, Ibuprofen, Motrin, Naproxen,          Naprosyn, Goodies powders or aspirin products. Ok to take Tylenol.   ____ Stop supplements until after surgery.    ____ Bring C-Pap to the hospital.

## 2016-09-19 ENCOUNTER — Encounter
Admission: RE | Admit: 2016-09-19 | Discharge: 2016-09-19 | Disposition: A | Discharge status: Home / Self Care | Payer: Medicaid Other | Source: Ambulatory Visit | Attending: Obstetrics and Gynecology | Admitting: Obstetrics and Gynecology

## 2016-09-19 DIAGNOSIS — Z01812 Encounter for preprocedural laboratory examination: Secondary | ICD-10-CM | POA: Insufficient documentation

## 2016-09-19 LAB — CBC
HCT: 40.9 % (ref 35.0–47.0)
HEMOGLOBIN: 13.6 g/dL (ref 12.0–16.0)
MCH: 26.7 pg (ref 26.0–34.0)
MCHC: 33.3 g/dL (ref 32.0–36.0)
MCV: 80.1 fL (ref 80.0–100.0)
PLATELETS: 169 10*3/uL (ref 150–440)
RBC: 5.1 MIL/uL (ref 3.80–5.20)
RDW: 16.7 % — ABNORMAL HIGH (ref 11.5–14.5)
WBC: 5.7 10*3/uL (ref 3.6–11.0)

## 2016-09-25 ENCOUNTER — Ambulatory Visit: Payer: Medicaid Other | Admitting: Anesthesiology

## 2016-09-25 ENCOUNTER — Encounter
Admission: RE | Disposition: A | Discharge status: Home / Self Care | Payer: Self-pay | Source: Ambulatory Visit | Attending: Obstetrics and Gynecology

## 2016-09-25 ENCOUNTER — Ambulatory Visit
Admission: RE | Admit: 2016-09-25 | Discharge: 2016-09-25 | Disposition: A | Discharge status: Home / Self Care | Payer: Medicaid Other | Source: Ambulatory Visit | Attending: Obstetrics and Gynecology | Admitting: Obstetrics and Gynecology

## 2016-09-25 DIAGNOSIS — Z9851 Tubal ligation status: Secondary | ICD-10-CM

## 2016-09-25 DIAGNOSIS — Z302 Encounter for sterilization: Secondary | ICD-10-CM | POA: Insufficient documentation

## 2016-09-25 HISTORY — PX: LAPAROSCOPIC TUBAL LIGATION: SHX1937

## 2016-09-25 LAB — POCT PREGNANCY, URINE: Preg Test, Ur: NEGATIVE

## 2016-09-25 SURGERY — LIGATION, FALLOPIAN TUBE, LAPAROSCOPIC
Anesthesia: General | Laterality: Bilateral

## 2016-09-25 MED ORDER — FENTANYL CITRATE (PF) 100 MCG/2ML IJ SOLN
INTRAMUSCULAR | Status: DC | PRN
  Administered 2016-09-25: 100 ug via INTRAVENOUS

## 2016-09-25 MED ORDER — BUPIVACAINE HCL 0.5 % IJ SOLN
INTRAMUSCULAR | Status: DC | PRN
  Administered 2016-09-25: 15 mL

## 2016-09-25 MED ORDER — IBUPROFEN 600 MG PO TABS: 600.0000 mg | ORAL_TABLET | Freq: Four times a day (QID) | ORAL | 0 refills | Status: DC | PRN

## 2016-09-25 MED ORDER — DEXAMETHASONE SODIUM PHOSPHATE 10 MG/ML IJ SOLN
INTRAMUSCULAR | Status: DC | PRN
  Administered 2016-09-25: 4 mg via INTRAVENOUS

## 2016-09-25 MED ORDER — PROMETHAZINE HCL 25 MG/ML IJ SOLN
INTRAMUSCULAR | Status: AC
  Administered 2016-09-25: 6.25 mg via INTRAVENOUS
  Filled 2016-09-25: qty 1

## 2016-09-25 MED ORDER — LACTATED RINGERS IV SOLN
INTRAVENOUS | Status: DC
  Administered 2016-09-25: 14:00:00 via INTRAVENOUS

## 2016-09-25 MED ORDER — OXYCODONE-ACETAMINOPHEN 5-325 MG PO TABS
ORAL_TABLET | ORAL | Status: AC
  Administered 2016-09-25: 1 via ORAL
  Filled 2016-09-25: qty 1

## 2016-09-25 MED ORDER — SUGAMMADEX SODIUM 200 MG/2ML IV SOLN
INTRAVENOUS | Status: DC | PRN
  Administered 2016-09-25: 150 mg via INTRAVENOUS

## 2016-09-25 MED ORDER — GLYCOPYRROLATE 0.2 MG/ML IJ SOLN
INTRAMUSCULAR | Status: DC | PRN
  Administered 2016-09-25: 0.1 mg via INTRAVENOUS

## 2016-09-25 MED ORDER — OXYCODONE HCL 5 MG/5ML PO SOLN: 5.0000 mg | Freq: Once | ORAL | Status: DC | PRN

## 2016-09-25 MED ORDER — BUPIVACAINE HCL (PF) 0.5 % IJ SOLN
INTRAMUSCULAR | Status: AC
Start: 2016-09-25 — End: 2016-09-25
  Filled 2016-09-25: qty 30

## 2016-09-25 MED ORDER — FAMOTIDINE 20 MG PO TABS
20.0000 mg | ORAL_TABLET | Freq: Once | ORAL | Status: AC
  Administered 2016-09-25: 20 mg via ORAL

## 2016-09-25 MED ORDER — OXYCODONE-ACETAMINOPHEN 5-325 MG PO TABS: 1.0000 | ORAL_TABLET | ORAL | 0 refills | Status: DC | PRN

## 2016-09-25 MED ORDER — PROPOFOL 10 MG/ML IV BOLUS
INTRAVENOUS | Status: DC | PRN
  Administered 2016-09-25: 150 mg via INTRAVENOUS

## 2016-09-25 MED ORDER — FENTANYL CITRATE (PF) 100 MCG/2ML IJ SOLN
INTRAMUSCULAR | Status: AC
  Administered 2016-09-25: 50 ug via INTRAVENOUS
  Filled 2016-09-25: qty 2

## 2016-09-25 MED ORDER — MIDAZOLAM HCL 2 MG/2ML IJ SOLN
INTRAMUSCULAR | Status: DC | PRN
  Administered 2016-09-25: 2 mg via INTRAVENOUS

## 2016-09-25 MED ORDER — HYDROMORPHONE HCL 1 MG/ML IJ SOLN
INTRAMUSCULAR | Status: AC
  Filled 2016-09-25: qty 1

## 2016-09-25 MED ORDER — LACTATED RINGERS IV SOLN
INTRAVENOUS | Status: DC | PRN
  Administered 2016-09-25: 15:00:00 via INTRAVENOUS

## 2016-09-25 MED ORDER — OXYCODONE HCL 5 MG PO TABS: 5.0000 mg | ORAL_TABLET | Freq: Once | ORAL | Status: DC | PRN

## 2016-09-25 MED ORDER — ONDANSETRON HCL 4 MG/2ML IJ SOLN
INTRAMUSCULAR | Status: DC | PRN
  Administered 2016-09-25: 4 mg via INTRAVENOUS

## 2016-09-25 MED ORDER — KETOROLAC TROMETHAMINE 30 MG/ML IJ SOLN
INTRAMUSCULAR | Status: DC | PRN
  Administered 2016-09-25: 30 mg via INTRAVENOUS

## 2016-09-25 MED ORDER — HYDROMORPHONE HCL 1 MG/ML IJ SOLN
INTRAMUSCULAR | Status: AC
  Administered 2016-09-25: 0.5 mg via INTRAVENOUS
  Filled 2016-09-25: qty 1

## 2016-09-25 MED ORDER — PROMETHAZINE HCL 25 MG/ML IJ SOLN
6.2500 mg | INTRAMUSCULAR | Status: DC | PRN
  Administered 2016-09-25: 6.25 mg via INTRAVENOUS

## 2016-09-25 MED ORDER — FENTANYL CITRATE (PF) 100 MCG/2ML IJ SOLN
25.0000 ug | INTRAMUSCULAR | Status: DC | PRN
  Administered 2016-09-25 (×3): 50 ug via INTRAVENOUS

## 2016-09-25 MED ORDER — MEPERIDINE HCL 25 MG/ML IJ SOLN: 6.2500 mg | INTRAMUSCULAR | Status: DC | PRN

## 2016-09-25 MED ORDER — ROCURONIUM BROMIDE 100 MG/10ML IV SOLN
INTRAVENOUS | Status: DC | PRN
  Administered 2016-09-25: 30 mg via INTRAVENOUS

## 2016-09-25 MED ORDER — FAMOTIDINE 20 MG PO TABS
ORAL_TABLET | ORAL | Status: AC
  Administered 2016-09-25: 20 mg via ORAL
  Filled 2016-09-25: qty 1

## 2016-09-25 MED ORDER — SODIUM CHLORIDE 0.9 % IJ SOLN
INTRAMUSCULAR | Status: AC
  Administered 2016-09-25: 10 mL
  Filled 2016-09-25: qty 10

## 2016-09-25 MED ORDER — OXYCODONE-ACETAMINOPHEN 5-325 MG PO TABS
1.0000 | ORAL_TABLET | ORAL | Status: DC | PRN
  Administered 2016-09-25: 1 via ORAL

## 2016-09-25 MED ORDER — HYDROMORPHONE HCL 1 MG/ML IJ SOLN
0.5000 mg | INTRAMUSCULAR | Status: DC | PRN
  Administered 2016-09-25 (×3): 0.5 mg via INTRAVENOUS

## 2016-09-25 MED ORDER — LIDOCAINE HCL (CARDIAC) 20 MG/ML IV SOLN
INTRAVENOUS | Status: DC | PRN
  Administered 2016-09-25: 60 mg via INTRAVENOUS

## 2016-09-25 SURGICAL SUPPLY — 24 items
BLADE SURG SZ11 CARB STEEL (BLADE) ×3 IMPLANT
CATH ROBINSON RED A/P 16FR (CATHETERS) ×3 IMPLANT
CHLORAPREP W/TINT 26ML (MISCELLANEOUS) ×3 IMPLANT
DERMABOND ADVANCED (GAUZE/BANDAGES/DRESSINGS) ×2
DERMABOND ADVANCED .7 DNX12 (GAUZE/BANDAGES/DRESSINGS) ×1 IMPLANT
DRSG TEGADERM 2-3/8X2-3/4 SM (GAUZE/BANDAGES/DRESSINGS) ×3 IMPLANT
DRSG TEGADERM 2X2.25 PEDS (GAUZE/BANDAGES/DRESSINGS) ×9 IMPLANT
DRSG TELFA 3X8 NADH (GAUZE/BANDAGES/DRESSINGS) ×3 IMPLANT
GLOVE BIO SURGEON STRL SZ7 (GLOVE) ×3 IMPLANT
GLOVE INDICATOR 7.5 STRL GRN (GLOVE) ×3 IMPLANT
GOWN STRL REUS W/ TWL LRG LVL3 (GOWN DISPOSABLE) ×2 IMPLANT
GOWN STRL REUS W/TWL LRG LVL3 (GOWN DISPOSABLE) ×4
KIT DISPOSABLE FALLOPE RING (Ring) ×3 IMPLANT
KIT RM TURNOVER CYSTO AR (KITS) ×3 IMPLANT
LABEL OR SOLS (LABEL) ×3 IMPLANT
NS IRRIG 500ML POUR BTL (IV SOLUTION) ×3 IMPLANT
PACK GYN LAPAROSCOPIC (MISCELLANEOUS) ×3 IMPLANT
PAD OB MATERNITY 4.3X12.25 (PERSONAL CARE ITEMS) ×3 IMPLANT
PAD PREP 24X41 OB/GYN DISP (PERSONAL CARE ITEMS) ×3 IMPLANT
SHEARS HARMONIC ACE PLUS 36CM (ENDOMECHANICALS) ×3 IMPLANT
SLEEVE ENDOPATH XCEL 5M (ENDOMECHANICALS) ×3 IMPLANT
SUT MNCRL AB 4-0 PS2 18 (SUTURE) ×3 IMPLANT
TROCAR XCEL NON-BLD 5MMX100MML (ENDOMECHANICALS) ×3 IMPLANT
TUBING INSUFFLATOR HI FLOW (MISCELLANEOUS) ×3 IMPLANT

## 2016-09-25 NOTE — Transfer of Care (Signed)
Immediate Anesthesia Transfer of Care Note  Patient: Stefanie Gomez  Procedure(s) Performed: Procedure(s): LAPAROSCOPIC TUBAL LIGATION (Bilateral)  Patient Location: PACU  Anesthesia Type:General  Level of Consciousness: sedated  Airway & Oxygen Therapy: Patient Spontanous Breathing and Patient connected to face mask oxygen  Post-op Assessment: Report given to RN and Post -op Vital signs reviewed and stable  Post vital signs: Reviewed and stable  Last Vitals:  Vitals:   09/25/16 1319 09/25/16 1547  BP: 126/84 112/72  Pulse: 80 65  Resp: 16 15  Temp: 37.4 C 36.2 C    Last Pain:  Vitals:   09/25/16 1319  TempSrc: Tympanic         Complications: No apparent anesthesia complications

## 2016-09-25 NOTE — Discharge Instructions (Signed)

## 2016-09-25 NOTE — H&P (Signed)
Initial paper H&P reviewed:   History reviewed, patient examined, no change in status, stable for surgery.  

## 2016-09-25 NOTE — Op Note (Addendum)
Preoperative Diagnosis: 1) 25 y.o.  [id number] desiring permanent surgical sterilization  Postoperative Diagnosis: 1) 25 y.o. [id number] desiring permanent surgical sterilization  Operation Performed: Laparoscopic bilateral tubal ligation via falope ring application left tube, partial salpingectomy right tube  Indication: 25 y.o. [id number]  with undesired fertility, desires permanent sterilization.  Other reversible forms of contraception were discussed with patient; she declines all other modalities. Permanent nature of as well as associated risks of the procedure discussed with patient including but not limited to: risk of regret, permanence of method, bleeding, infection, injury to surrounding organs and need for additional procedures.  Failure risk of 0.5-1% with increased risk of ectopic gestation if pregnancy occurs was also discussed with patient.    Surgeon: Malachy Mood, MD  Anesthesia: General  Preoperative Antibiotics: none  Estimated Blood Loss: 49mL  IV Fluids: 757mL  Urine Output:: 334mL  Drains or Tubes: none  Implants: none  Specimens Removed: none  Complications: none  Intraoperative Findings: Normal tubes, ovaries, and uterus.  Good 1cm knuckle of tube applied within left falope ring, 1cm partial salpingectomy right tube.  Patient Condition: stable  Procedure in Detail:  Patient was taken to the operating room where she was administered general anesthesia.  She was positioned in the dorsal lithotomy position utilizing Allen stirups, prepped and draped in the usual sterile fashion.  Prior to proceeding with procedure a time out was performed.  Attention was turned to the patient's pelvis.  A red rubber catheter was used to empty the patient's bladder.  An operative speculum was placed to allow visualization of the cervix.  The anterior lip of the cervix was grasped with a single tooth tenaculum, and a Hulka tenaculum was placed to allow manipulation of the uterus.  The  operative speculum and single tooth tenaculum were then removed.  Attention was turned to the patient's abdomen.  The umbilicus was infiltrated with 0.25% Bupivocaine, before making a stab incision using an 11 blade scalpel.  A 43mm Excel trocar was then used to gain direct entry into the peritoneal cavity utilizing the camera to visualize progress of the trocar during placement.  Once peritoneal entry had been achieved, insufflation was started and pneumoperitoneum established at a pressure of 75mmHg.   General inspection of the abdomen revealed the above noted findings.  A spot 2cm midline above the pubic symphysis was injected with 1% Sensorcaine and a stab incision was made using an 11 blade scalpel.  The 49mm falope ring trocar was place suprapubic through this incision under direct visualization.  The left tube was identified and grasped in a mid-isthmic portion using the falope ring application.  The falope ring was applied with a good 1cm knuckle of blanching tube noted within the falope ring after application.  The right tube was identified and a second falope ring was applied.  On releasing the tube it became apparent that the ring had misfired and deployed around the tongs preventing them from opening.  A left lateral 51mm excel trocar was placed under direct visualization.  A 32mm Harmonic was used to excise the midisthmic portion of the right tube which allowed removal of the falope ring applicator.  Pneumoperitoneum was evacuated.  The trocars were removed.  The 66mm trocar site was closed with 4-0 Monocryl in a subcuticular fashion.  All trocar sites were then dressed with surgical skin glue.  The Hulka tenaculum was removed.  Sponge needle and instrument counts were correct time two.  The patient tolerated the procedure well and was  taken to the recovery room in stable condition.

## 2016-09-25 NOTE — Anesthesia Preprocedure Evaluation (Signed)
Anesthesia Evaluation  Patient identified by MRN, date of birth, ID band Patient awake    Reviewed: Allergy & Precautions, NPO status , Patient's Chart, lab work & pertinent test results  History of Anesthesia Complications Negative for: history of anesthetic complications  Airway Mallampati: II  TM Distance: >3 FB Neck ROM: Full    Dental no notable dental hx.    Pulmonary neg pulmonary ROS, neg sleep apnea, neg COPD,    breath sounds clear to auscultation- rhonchi (-) wheezing      Cardiovascular Exercise Tolerance: Good (-) hypertension(-) CAD and (-) Past MI  Rhythm:Regular Rate:Normal - Systolic murmurs and - Diastolic murmurs    Neuro/Psych negative neurological ROS  negative psych ROS   GI/Hepatic negative GI ROS, Neg liver ROS,   Endo/Other  negative endocrine ROS  Renal/GU negative Renal ROS     Musculoskeletal negative musculoskeletal ROS (+)   Abdominal (+) - obese,   Peds  Hematology negative hematology ROS (+)   Anesthesia Other Findings   Reproductive/Obstetrics                             Anesthesia Physical Anesthesia Plan  ASA: I  Anesthesia Plan: General   Post-op Pain Management:    Induction: Intravenous  Airway Management Planned: Oral ETT  Additional Equipment:   Intra-op Plan:   Post-operative Plan: Extubation in OR  Informed Consent: I have reviewed the patients History and Physical, chart, labs and discussed the procedure including the risks, benefits and alternatives for the proposed anesthesia with the patient or authorized representative who has indicated his/her understanding and acceptance.   Dental advisory given  Plan Discussed with: CRNA and Anesthesiologist  Anesthesia Plan Comments:         Anesthesia Quick Evaluation

## 2016-09-25 NOTE — Anesthesia Procedure Notes (Signed)
Procedure Name: Intubation Date/Time: 09/25/2016 2:47 PM Performed by: Dionne Bucy Pre-anesthesia Checklist: Patient identified, Patient being monitored, Timeout performed, Emergency Drugs available and Suction available Patient Re-evaluated:Patient Re-evaluated prior to inductionOxygen Delivery Method: Circle system utilized Preoxygenation: Pre-oxygenation with 100% oxygen Intubation Type: IV induction Ventilation: Mask ventilation without difficulty Laryngoscope Size: Mac and 3 Grade View: Grade I Tube type: Oral Tube size: 7.0 mm Number of attempts: 1 Airway Equipment and Method: Stylet Placement Confirmation: ETT inserted through vocal cords under direct vision,  positive ETCO2 and breath sounds checked- equal and bilateral Secured at: 21 cm Tube secured with: Tape Dental Injury: Teeth and Oropharynx as per pre-operative assessment

## 2016-09-26 ENCOUNTER — Encounter: Payer: Self-pay | Admitting: Obstetrics and Gynecology

## 2016-09-26 NOTE — Anesthesia Postprocedure Evaluation (Signed)
Anesthesia Post Note  Patient: Stefanie Gomez  Procedure(s) Performed: Procedure(s) (LRB): LAPAROSCOPIC TUBAL LIGATION (Bilateral)  Patient location during evaluation: PACU Anesthesia Type: General Level of consciousness: awake and alert Pain management: pain level controlled Vital Signs Assessment: post-procedure vital signs reviewed and stable Respiratory status: spontaneous breathing, nonlabored ventilation and respiratory function stable Cardiovascular status: blood pressure returned to baseline and stable Postop Assessment: no signs of nausea or vomiting Anesthetic complications: no    Last Vitals:  Vitals:   09/25/16 1754 09/25/16 1812  BP: 104/62 (!) 109/53  Pulse: 68 65  Resp: 20 20  Temp:      Last Pain:  Vitals:   09/25/16 1710  TempSrc:   PainSc: 8                  Chaquana Nichols

## 2016-09-29 LAB — SURGICAL PATHOLOGY

## 2017-02-04 ENCOUNTER — Ambulatory Visit (INDEPENDENT_AMBULATORY_CARE_PROVIDER_SITE_OTHER): Payer: Self-pay | Admitting: Obstetrics and Gynecology

## 2017-02-04 ENCOUNTER — Encounter: Payer: Self-pay | Admitting: Obstetrics and Gynecology

## 2017-02-04 VITALS — BP 126/84 | HR 104 | Ht 68.0 in | Wt 158.0 lb

## 2017-02-04 DIAGNOSIS — R87613 High grade squamous intraepithelial lesion on cytologic smear of cervix (HGSIL): Secondary | ICD-10-CM

## 2017-02-04 DIAGNOSIS — D069 Carcinoma in situ of cervix, unspecified: Secondary | ICD-10-CM | POA: Insufficient documentation

## 2017-02-04 NOTE — Patient Instructions (Signed)
Cervical Dysplasia Cervical dysplasia is a condition in which a woman's cervix cells have abnormal changes. The cervix is the opening of the uterus (womb). It is located between the vagina and the uterus. Cervical dysplasia may be an early sign of cervical cancer. If left untreated, this condition may become more severe and may progress to cervical cancer. Early detection, treatment, and follow-up care are very important. What are the causes? Cervical dysplasia can be caused by a human papillomavirus (HPV) infection. HPV is the most common sexually transmitted infection (STI). HPV is spread from person to person through sexual contact. This includes oral, vaginal, or anal sex. What increases the risk? The following factors may make you more likely to develop this condition:  Having had a sexually transmitted infection (STI), such as herpes, chlamydia or HPV.  Becoming sexually active before age 18.  Having had more than one sexual partner.  Having a sexual partner who has multiple sexual partners.  Not using protection, such as a condom, during sex, especially with new sexual partners.  Having a history of cancer of the vagina or vulva.  Having a weakened body defense (immune) system.  Being the daughter of a woman who took diethylstilbestrol (DES) during pregnancy.  Having a family history of cervical cancer.  Smoking.  Using oral contraceptives, also called birth control pills.  Having had three or more full-term pregnancies.  What are the signs or symptoms? There are usually no symptoms of this condition. If you do have symptoms, they may include:  Abnormal vaginal discharge.  Bleeding between periods or after sex.  Bleeding during menopause.  Pain during sex (dyspareunia).  How is this diagnosed? A test called a Pap test may be done. During this test, cells are taken from the cervix and then examined under a microscope. A test in which tissue is removed from the cervix  (biopsy) may also be done if the Pap test is abnormal or if the cervix looks abnormal. How is this treated? Treatment varies based on the severity of the condition. Treatment may include:  Cryotherapy. During cryotherapy, the abnormal cells are frozen with a steel-tip instrument.  Loop electrosurgical excision procedure (LEEP). This procedure removes abnormal tissue from the cervix.  Surgery to remove abnormal tissue. This is usually done in more advanced cases. Surgical options include: ? A cone biopsy. This is a procedure in which the cervical canal and a portion of the center of the cervix are removed. ? Hysterectomy. This is a surgery in which the uterus and cervix are removed.  Follow these instructions at home:  Take over-the-counter and prescription medicines only as told by your health care provider.  Do not use tampons, have sex, or douche until your health care provider says it is okay.  Keep follow-up visits as told by your health care provider. This is important. Women who have been treated for cervical dysplasia should have regular pelvic exams and Pap tests as told by their health care provider. How is this prevented?  Practice safe sex to help prevent sexually transmitted infections (STI) that may cause this condition.  Have regular Pap tests. Talk with your health care provider about how often you need these tests. Pap tests will help identify cell changes that can lead to cancer. Contact a health care provider if:  You develop genital warts. Get help right away if:  You have a fever.  You have abnormal vaginal discharge.  Your menstrual period is heavier than normal.  You develop bright   red bleeding. This may include blood clots.  You have pain or cramps that get worse, and medicine does not help to relieve your pain.  You feel light-headed and you are unusually weak.  You have fainting spells.  You have pain in the abdomen. Summary  Cervical dysplasia  is a condition in which a woman's cervix cells have abnormal changes.  If left untreated, this condition may become more severe and may progress to cervical cancer.  Early detection, treatment, and follow-up care are very important in managing this condition.  Have regular pelvic exams and Pap tests. Talk with your health care provider about how often you need these tests. Pap tests will help identify cell changes that can lead to cancer. This information is not intended to replace advice given to you by your health care provider. Make sure you discuss any questions you have with your health care provider. Document Released: 10/22/2005 Document Revised: 10/25/2016 Document Reviewed: 10/25/2016 Elsevier Interactive Patient Education  2017 Elsevier Inc.  

## 2017-02-04 NOTE — Progress Notes (Signed)
GYNECOLOGY CLINIC COLPOSCOPY PROCEDURE NOTE  26 y.o. [id number] here for colposcopy for high-grade squamous intraepithelial neoplasia  (HGSIL-encompassing moderate and severe dysplasia) pap smear on 09/11/2016. Discussed underlying role for HPV infection in the development of cervical dysplasia, its natural history and progression/regression, need for surveillance.  Is the patient  pregnant: No LMP: Patient's last menstrual period was 01/06/2017 (approximate). Smoking status:  Metrics: Intervention Frequency ACO  Documented Smoking Status Yearly  Screened one or more times in 24 months  Cessation Counseling or  Active cessation medication Past 24 months  Past 24 months   Guideline developer: UpToDate (See UpToDate for funding source) Date Released: 2014   Contraception: BTL High risk partner: No.   History of STD:  No. Future fertility desired:  No.  Patient given informed consent, signed copy in the chart, time out was performed.  The patient was position in dorsal lithotomy position. Speculum was placed the cervix was visualized.   After application of acetic acid colposcopic inspection of the cervix was undertaken.   Colposcopy adequate, full visualization of transformation zone: Yes no visible lesions; corresponding biopsies obtained.   ECC specimen obtained:  Yes All specimens were labeled and sent to pathology.   Patient was given post procedure instructions.  Will follow up pathology and manage accordingly.  Routine preventative health maintenance measures emphasized.  Physical Exam  Genitourinary:      Malachy Mood, MD, [facility], [place]

## 2017-02-07 LAB — PATHOLOGY

## 2017-02-08 ENCOUNTER — Encounter: Payer: Self-pay | Admitting: Obstetrics and Gynecology

## 2017-02-11 ENCOUNTER — Telehealth: Payer: Self-pay | Admitting: Obstetrics and Gynecology

## 2017-02-11 NOTE — Telephone Encounter (Signed)
-----   Message from Malachy Mood, MD sent at 02/08/2017  1:13 PM EDT ----- Regarding: Cone Surgery Date:   LOS: outpatient  Surgery Booking Request Patient Full Name: Stefanie Gomez, Stefanie Gomez MRN: [id number]  DOB: 1991-10-31  Surgeon: Malachy Mood, MD  Requested Surgery Date and Time: 2-4 weeks Primary Diagnosis and Code: CIN II and AIS Secondary Diagnosis and Code:  Surgical Procedure: Cold knife conization L&D Notification:N/A Admission Status: same day surgery Length of Surgery: 1hr Special Case Needs: none H&P: can do day of surgery (date) Phone Interview or Office Pre-Admit: phone should be fine Interpreter: none Language: none Medical Clearance: none Special Scheduling Instructions: Patient was self pay for colposcopy can we check to see if she qualifies for coverage with [employer]

## 2017-02-11 NOTE — Telephone Encounter (Signed)
-----   Message from Malachy Mood, MD sent at 02/08/2017  1:13 PM EDT ----- Regarding: Cone Surgery Date:   LOS: outpatient  Surgery Booking Request Patient Full Name: Stefanie, Gomez MRN: [id number]  DOB: 07/23/1991  Surgeon: Malachy Mood, MD  Requested Surgery Date and Time: 2-4 weeks Primary Diagnosis and Code: CIN II and AIS Secondary Diagnosis and Code:  Surgical Procedure: Cold knife conization L&D Notification:N/A Admission Status: same day surgery Length of Surgery: 1hr Special Case Needs: none H&P: can do day of surgery (date) Phone Interview or Office Pre-Admit: phone should be fine Interpreter: none Language: none Medical Clearance: none Special Scheduling Instructions: Patient was self pay for colposcopy can we check to see if she qualifies for coverage with [employer]

## 2017-02-14 ENCOUNTER — Telehealth: Payer: Self-pay

## 2017-02-14 NOTE — Telephone Encounter (Signed)
Please call patient

## 2017-02-14 NOTE — Telephone Encounter (Signed)
Discussed with patient that cone biopsy is the first step in evaluation, hysterectomy would be an option pending results of cone biopsy.  Stefanie Gomez have we heard anything else on if she will qualify for BCCP?

## 2017-02-14 NOTE — Telephone Encounter (Signed)
Pt would like AMS to call her about the bx results that he called about alst week.  [phone]

## 2017-02-14 NOTE — Telephone Encounter (Signed)
Per [facility], they will not be able to cover the patient, but she suggested the patient go online to apply for the [facility]. I spoke to the patient who will apply. The patient decided to go ahead and schedule the surgery. Patient is aware of H&P on 02/25/17 @ 8:30am with P.A.T. Phone interview, and OR on 02/28/17.

## 2017-02-15 ENCOUNTER — Telehealth: Payer: Self-pay | Admitting: Obstetrics and Gynecology

## 2017-02-15 NOTE — Telephone Encounter (Signed)
Patient called to say she has been approved for Medicaid, effective 03/05/17. She also said she spoke to Winfield Cunas at [facility] who said Medicaid would cover her 90 days back, and that Amy confirmed this with someone there from [place]. I told her I could not confirm whether that was correct, and offered to move her surgery to 03/05/17 or 03/08/17. Patient decided to leave the surgery on 02/28/17 and said she would call back Monday if she changed her mind. I confirmed with the patient that I am leaving the surgery on 02/28/17 and not rescheduling her unless I hear from her, and she said that was correct. Patient has my phone# and ext.

## 2017-02-21 ENCOUNTER — Telehealth: Payer: Self-pay | Admitting: Obstetrics and Gynecology

## 2017-02-21 NOTE — Telephone Encounter (Signed)
Patient called to reschedule surgery due to insurance. Patient said she was told regular Medicaid will be active 03/05/17, that she doesn't think it will be retroactive, and that she needs to reschedule her surgery. On [place] [employer] the patient is active today for FPW, and patient is aware that will not cover her surgery, but she said she will have regular Medicaid on 03/05/17. Patient rescheduled for 03/05/17.

## 2017-02-25 ENCOUNTER — Encounter
Admission: RE | Admit: 2017-02-25 | Discharge: 2017-02-25 | Disposition: A | Discharge status: Home / Self Care | Payer: Self-pay | Source: Ambulatory Visit | Attending: Obstetrics and Gynecology | Admitting: Obstetrics and Gynecology

## 2017-02-25 ENCOUNTER — Ambulatory Visit (INDEPENDENT_AMBULATORY_CARE_PROVIDER_SITE_OTHER): Payer: Self-pay | Admitting: Obstetrics and Gynecology

## 2017-02-25 ENCOUNTER — Encounter: Payer: Self-pay | Admitting: Obstetrics and Gynecology

## 2017-02-25 VITALS — BP 130/68 | HR 93 | Ht 68.0 in | Wt 159.0 lb

## 2017-02-25 DIAGNOSIS — N871 Moderate cervical dysplasia: Secondary | ICD-10-CM

## 2017-02-25 DIAGNOSIS — D069 Carcinoma in situ of cervix, unspecified: Secondary | ICD-10-CM

## 2017-02-25 HISTORY — DX: Nausea with vomiting, unspecified: R11.2

## 2017-02-25 HISTORY — DX: Other complications of anesthesia, initial encounter: T88.59XA

## 2017-02-25 HISTORY — DX: Other specified postprocedural states: Z98.890

## 2017-02-25 HISTORY — DX: Adverse effect of unspecified anesthetic, initial encounter: T41.45XA

## 2017-02-25 MED ORDER — MEDROXYPROGESTERONE ACETATE 10 MG PO TABS: 10.0000 mg | ORAL_TABLET | Freq: Every day | ORAL | 2 refills | Status: DC

## 2017-02-25 NOTE — Progress Notes (Signed)
      Obstetrics & Gynecology Surgery H&P    Chief Complaint: Scheduled Surgery   History of Present Illness: Patient is a 26 y.o. 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   Review of Systems:10 point review of systems  Past Medical History:  Past Medical History:  Diagnosis Date  . Family history of adverse reaction to anesthesia    SISTER-HARD TIME WAKING UP    Past Surgical History:  Past Surgical History:  Procedure Laterality Date  . APPENDECTOMY    . LAPAROSCOPIC TUBAL LIGATION Bilateral 09/25/2016   Procedure: LAPAROSCOPIC TUBAL LIGATION;  Surgeon: Malachy Mood, MD;  Location: [facility] ORS;  Service: Gynecology;  Laterality: Bilateral;  . TONSILLECTOMY    . TUBAL LIGATION  09/2016  . WISDOM TOOTH EXTRACTION      Family History:  Family History  Problem Relation Age of Onset  . Colon cancer Father 107  . Lung cancer Father   . Rectal cancer Father     Social History:  Social History   Social History  . Marital status: Single    Spouse name: N/A  . Number of children: N/A  . Years of education: N/A   Occupational History  . Not on file.   Social History Main Topics  . Smoking status: Never Smoker  . Smokeless tobacco: Never Used  . Alcohol use No  . Drug use: No  . Sexual activity: Yes    Birth control/ protection: Surgical     Comment: BTL   Other Topics Concern  . Not on file   Social History Narrative  . No narrative on file    Allergies:  Allergies  Allergen Reactions  . Bactericin [Bacitracin] Rash    Medications: Prior to Admission medications   Not on File    Physical Exam Vitals: Blood pressure 130/68, pulse 93, height 5\' 8"  (1.727 m), weight 159 lb (72.1 kg), last menstrual period 02/06/2017, not currently breastfeeding. General: NAD HEENT: normocephalic, anicteric Pulmonary: No  increased work of breathing Cardiovascular: RRR, distal pulses 2+ Abdomen: soft, non-tender Genitourinary: deferred Extremities: no edema, erythema, or tenderness Neurologic: Grossly intact Psychiatric: mood appropriate, affect full  Imaging No results found.  Assessment: 26 y.o. [id number] presenting for scheduled cold knife conization   Plan: 1) We discussed CKC procedure, risk, benefits.  Margin status also discussed  2) Routine postoperative instructions were reviewed with the patient and her family in detail today including the expected length of recovery and likely postoperative course.  The patient concurred with the proposed plan, giving informed written consent for the surgery today.  Patient instructed on the importance of being NPO after midnight prior to her procedure.  If warranted preoperative prophylactic antibiotics and SCDs ordered on call to the OR to meet SCIP guidelines and adhere to recommendation laid forth in [place] Number 104 May 2009  "Antibiotic Prophylaxis for Gynecologic Procedures".

## 2017-02-25 NOTE — Patient Instructions (Signed)
  Your procedure is scheduled on: 03-05-17 Report to Same Day Surgery 2nd floor medical mall [facility] Entrance-take elevator on left to 2nd floor.  Check in with surgery information desk.) To find out your arrival time please call [phone] between 1PM - 3PM on 03-04-17  Remember: Instructions that are not followed completely may result in serious medical risk, up to and including death, or upon the discretion of your surgeon and anesthesiologist your surgery may need to be rescheduled.    _x___ 1. Do not eat food or drink liquids after midnight. No gum chewing or                              hard candies.     __x__ 2. No Alcohol for 24 hours before or after surgery.   __x__3. No Smoking for 24 prior to surgery.   ____  4. Bring all medications with you on the day of surgery if instructed.    __x__ 5. Notify your doctor if there is any change in your medical condition     (cold, fever, infections).     Do not wear jewelry, make-up, hairpins, clips or nail polish.  Do not wear lotions, powders, or perfumes. You may wear deodorant.  Do not shave 48 hours prior to surgery. Men may shave face and neck.  Do not bring valuables to the hospital.    [facility] is not responsible for any belongings or valuables.               Contacts, dentures or bridgework may not be worn into surgery.  Leave your suitcase in the car. After surgery it may be brought to your room.  For patients admitted to the hospital, discharge time is determined by your                       treatment team.   Patients discharged the day of surgery will not be allowed to drive home.  You will need someone to drive you home and stay with you the night of your procedure.    Please read over the following fact sheets that you were given:   [facility] and or MRSA Information   ____ Take anti-hypertensive (unless it includes a diuretic), cardiac, seizure, asthma,     anti-reflux and psychiatric  medicines. These include:  1. NONE  2.  3.  4.  5.  6.  ____Fleets enema or Magnesium Citrate as directed.   ____ Use CHG Soap or sage wipes as directed on instruction sheet   ____ Use inhalers on the day of surgery and bring to hospital day of surgery  ____ Stop Metformin and Janumet 2 days prior to surgery.    ____ Take 1/2 of usual insulin dose the night before surgery and none on the morning     surgery.   ____ Follow recommendations from Cardiologist, Pulmonologist or PCP regarding          stopping Aspirin, Coumadin, Pllavix ,Eliquis, Effient, or Pradaxa, and Pletal.  X____Stop Anti-inflammatories such as Advil, Aleve, Ibuprofen, Motrin, Naproxen, Naprosyn, Goodies powders or aspirin products NOW- OK to take Tylenol    ____ Stop supplements until after surgery.   ____ Bring C-Pap to the hospital.

## 2017-03-04 ENCOUNTER — Encounter: Payer: Self-pay | Admitting: *Deleted

## 2017-03-05 ENCOUNTER — Encounter
Admission: RE | Disposition: A | Discharge status: Home / Self Care | Payer: Self-pay | Source: Ambulatory Visit | Attending: Obstetrics and Gynecology

## 2017-03-05 ENCOUNTER — Ambulatory Visit: Payer: Medicaid Other | Admitting: Anesthesiology

## 2017-03-05 ENCOUNTER — Encounter: Payer: Self-pay | Admitting: *Deleted

## 2017-03-05 ENCOUNTER — Ambulatory Visit
Admission: RE | Admit: 2017-03-05 | Discharge: 2017-03-05 | Disposition: A | Discharge status: Home / Self Care | Payer: Medicaid Other | Source: Ambulatory Visit | Attending: Obstetrics and Gynecology | Admitting: Obstetrics and Gynecology

## 2017-03-05 DIAGNOSIS — D067 Carcinoma in situ of other parts of cervix: Secondary | ICD-10-CM | POA: Insufficient documentation

## 2017-03-05 DIAGNOSIS — Z9889 Other specified postprocedural states: Secondary | ICD-10-CM

## 2017-03-05 DIAGNOSIS — R87613 High grade squamous intraepithelial lesion on cytologic smear of cervix (HGSIL): Secondary | ICD-10-CM

## 2017-03-05 DIAGNOSIS — N871 Moderate cervical dysplasia: Secondary | ICD-10-CM | POA: Diagnosis not present

## 2017-03-05 HISTORY — DX: Other specified health status: Z78.9

## 2017-03-05 HISTORY — PX: CERVICAL CONE BIOPSY: SUR198

## 2017-03-05 HISTORY — PX: CERVICAL CONIZATION W/BX: SHX1330

## 2017-03-05 LAB — POCT PREGNANCY, URINE: Preg Test, Ur: NEGATIVE

## 2017-03-05 SURGERY — CONE BIOPSY, CERVIX
Anesthesia: General

## 2017-03-05 MED ORDER — FENTANYL CITRATE (PF) 100 MCG/2ML IJ SOLN
INTRAMUSCULAR | Status: DC | PRN
  Administered 2017-03-05 (×4): 25 ug via INTRAVENOUS

## 2017-03-05 MED ORDER — LIDOCAINE HCL (CARDIAC) 20 MG/ML IV SOLN
INTRAVENOUS | Status: DC | PRN
  Administered 2017-03-05: 60 mg via INTRAVENOUS

## 2017-03-05 MED ORDER — LACTATED RINGERS IV SOLN
INTRAVENOUS | Status: DC
  Administered 2017-03-05: 12:00:00 via INTRAVENOUS

## 2017-03-05 MED ORDER — LIDOCAINE-EPINEPHRINE 1 %-1:100000 IJ SOLN
INTRAMUSCULAR | Status: DC | PRN
  Administered 2017-03-05: 18 mL

## 2017-03-05 MED ORDER — DEXAMETHASONE SODIUM PHOSPHATE 10 MG/ML IJ SOLN
INTRAMUSCULAR | Status: DC | PRN
  Administered 2017-03-05: 10 mg via INTRAVENOUS

## 2017-03-05 MED ORDER — FERRIC SUBSULFATE 259 MG/GM EX SOLN
CUTANEOUS | Status: AC
  Filled 2017-03-05: qty 8

## 2017-03-05 MED ORDER — SCOPOLAMINE 1 MG/3DAYS TD PT72
MEDICATED_PATCH | TRANSDERMAL | Status: AC
  Filled 2017-03-05: qty 1

## 2017-03-05 MED ORDER — OXYCODONE HCL 5 MG PO TABS
ORAL_TABLET | ORAL | Status: AC
  Administered 2017-03-05: 5 mg via ORAL
  Filled 2017-03-05: qty 1

## 2017-03-05 MED ORDER — FENTANYL CITRATE (PF) 100 MCG/2ML IJ SOLN
25.0000 ug | INTRAMUSCULAR | Status: DC | PRN
  Administered 2017-03-05 (×2): 25 ug via INTRAVENOUS

## 2017-03-05 MED ORDER — FENTANYL CITRATE (PF) 100 MCG/2ML IJ SOLN
INTRAMUSCULAR | Status: AC
  Administered 2017-03-05: 25 ug via INTRAVENOUS
  Filled 2017-03-05: qty 2

## 2017-03-05 MED ORDER — DEXAMETHASONE SODIUM PHOSPHATE 10 MG/ML IJ SOLN
INTRAMUSCULAR | Status: AC
  Filled 2017-03-05: qty 1

## 2017-03-05 MED ORDER — OXYCODONE HCL 5 MG PO TABS
5.0000 mg | ORAL_TABLET | Freq: Once | ORAL | Status: AC | PRN
  Administered 2017-03-05: 5 mg via ORAL

## 2017-03-05 MED ORDER — FAMOTIDINE 20 MG PO TABS
ORAL_TABLET | ORAL | Status: AC
  Administered 2017-03-05: 20 mg via ORAL
  Filled 2017-03-05: qty 1

## 2017-03-05 MED ORDER — IODINE STRONG (LUGOLS) 5 % PO SOLN
ORAL | Status: AC
  Filled 2017-03-05: qty 1

## 2017-03-05 MED ORDER — FERRIC SUBSULFATE 259 MG/GM EX SOLN
CUTANEOUS | Status: DC | PRN
  Administered 2017-03-05: 1 via TOPICAL

## 2017-03-05 MED ORDER — MIDAZOLAM HCL 5 MG/5ML IJ SOLN
INTRAMUSCULAR | Status: DC | PRN
  Administered 2017-03-05: 2 mg via INTRAVENOUS

## 2017-03-05 MED ORDER — PROMETHAZINE HCL 25 MG/ML IJ SOLN: 6.2500 mg | INTRAMUSCULAR | Status: DC | PRN

## 2017-03-05 MED ORDER — PROPOFOL 10 MG/ML IV BOLUS
INTRAVENOUS | Status: DC | PRN
  Administered 2017-03-05: 150 mg via INTRAVENOUS

## 2017-03-05 MED ORDER — ONDANSETRON HCL 4 MG/2ML IJ SOLN
INTRAMUSCULAR | Status: DC | PRN
  Administered 2017-03-05: 4 mg via INTRAVENOUS

## 2017-03-05 MED ORDER — SCOPOLAMINE 1 MG/3DAYS TD PT72
1.0000 | MEDICATED_PATCH | TRANSDERMAL | Status: DC
  Administered 2017-03-05: 1.5 mg via TRANSDERMAL

## 2017-03-05 MED ORDER — EPHEDRINE SULFATE 50 MG/ML IJ SOLN
INTRAMUSCULAR | Status: DC | PRN
  Administered 2017-03-05 (×2): 10 mg via INTRAVENOUS

## 2017-03-05 MED ORDER — ONDANSETRON HCL 4 MG/2ML IJ SOLN
INTRAMUSCULAR | Status: AC
  Filled 2017-03-05: qty 2

## 2017-03-05 MED ORDER — OXYCODONE HCL 5 MG/5ML PO SOLN: 5.0000 mg | Freq: Once | ORAL | Status: AC | PRN

## 2017-03-05 MED ORDER — HYDROCODONE-ACETAMINOPHEN 5-325 MG PO TABS: 1.0000 | ORAL_TABLET | Freq: Four times a day (QID) | ORAL | 0 refills | Status: DC | PRN

## 2017-03-05 MED ORDER — IBUPROFEN 600 MG PO TABS: 600.0000 mg | ORAL_TABLET | Freq: Four times a day (QID) | ORAL | 3 refills | Status: DC | PRN

## 2017-03-05 MED ORDER — IODINE STRONG (LUGOLS) 5 % PO SOLN
ORAL | Status: DC | PRN
  Administered 2017-03-05: 5 mL

## 2017-03-05 MED ORDER — LIDOCAINE-EPINEPHRINE 1 %-1:100000 IJ SOLN
INTRAMUSCULAR | Status: AC
  Filled 2017-03-05: qty 1

## 2017-03-05 MED ORDER — FAMOTIDINE 20 MG PO TABS
20.0000 mg | ORAL_TABLET | Freq: Once | ORAL | Status: AC
  Administered 2017-03-05: 20 mg via ORAL

## 2017-03-05 SURGICAL SUPPLY — 24 items
APPLICATOR COTTON TIP 6IN STRL (MISCELLANEOUS) ×18 IMPLANT
BLADE SURG SZ11 CARB STEEL (BLADE) ×3 IMPLANT
CATH ROBINSON RED A/P 16FR (CATHETERS) ×3 IMPLANT
DRAPE PERI LITHO V/GYN (MISCELLANEOUS) ×3 IMPLANT
DRAPE UNDER BUTTOCK W/FLU (DRAPES) ×3 IMPLANT
DRSG TELFA 3X8 NADH (GAUZE/BANDAGES/DRESSINGS) ×3 IMPLANT
ELECT REM PT RETURN 9FT ADLT (ELECTROSURGICAL) ×3
ELECTRODE REM PT RTRN 9FT ADLT (ELECTROSURGICAL) ×1 IMPLANT
GLOVE BIO SURGEON STRL SZ7 (GLOVE) ×3 IMPLANT
GLOVE INDICATOR 7.5 STRL GRN (GLOVE) ×3 IMPLANT
GOWN STRL REUS W/ TWL LRG LVL3 (GOWN DISPOSABLE) ×2 IMPLANT
GOWN STRL REUS W/TWL LRG LVL3 (GOWN DISPOSABLE) ×4
HEMOSTAT SURGICEL 2X3 (HEMOSTASIS) ×3 IMPLANT
KIT RM TURNOVER CYSTO AR (KITS) ×3 IMPLANT
LABEL OR SOLS (LABEL) ×3 IMPLANT
NEEDLE HYPO 25GX1 SAFETY (NEEDLE) ×3 IMPLANT
NEEDLE SPNL 22GX3.5 QUINCKE BK (NEEDLE) ×3 IMPLANT
PACK BASIN MINOR ARMC (MISCELLANEOUS) ×3 IMPLANT
PAD OB MATERNITY 4.3X12.25 (PERSONAL CARE ITEMS) ×3 IMPLANT
PAD PREP 24X41 OB/GYN DISP (PERSONAL CARE ITEMS) ×3 IMPLANT
SPONGE XRAY 4X4 16PLY STRL (MISCELLANEOUS) ×3 IMPLANT
SUT VIC AB 0 CT1 36 (SUTURE) ×3 IMPLANT
SYR CONTROL 10ML (SYRINGE) ×3 IMPLANT
SYRINGE 10CC LL (SYRINGE) ×3 IMPLANT

## 2017-03-05 NOTE — Anesthesia Procedure Notes (Signed)
Procedure Name: LMA Insertion Date/Time: 03/05/2017 12:03 PM Performed by: Dionne Bucy Pre-anesthesia Checklist: Patient identified, Patient being monitored, Timeout performed, Emergency Drugs available and Suction available Patient Re-evaluated:Patient Re-evaluated prior to inductionOxygen Delivery Method: Circle system utilized Preoxygenation: Pre-oxygenation with 100% oxygen Intubation Type: IV induction Ventilation: Mask ventilation without difficulty LMA: LMA inserted LMA Size: 4.0 Tube type: Oral Number of attempts: 1 Placement Confirmation: positive ETCO2 and breath sounds checked- equal and bilateral Tube secured with: Tape Dental Injury: Teeth and Oropharynx as per pre-operative assessment

## 2017-03-05 NOTE — Transfer of Care (Signed)
Immediate Anesthesia Transfer of Care Note  Patient: Dashanna Kinnamon  Procedure(s) Performed: Procedure(s): CONIZATION CERVIX WITH BIOPSY (N/A)  Patient Location: PACU  Anesthesia Type:General  Level of Consciousness: awake  Airway & Oxygen Therapy: Patient Spontanous Breathing and Patient connected to face mask oxygen  Post-op Assessment: Report given to RN and Post -op Vital signs reviewed and stable  Post vital signs: Reviewed and stable  Last Vitals:  Vitals:   03/05/17 1128 03/05/17 1300  BP: (!) 144/85 124/66  Pulse: 84 (!) 106  Resp: 20 12  Temp: 36.9 C 37 C    Last Pain: There were no vitals filed for this visit.       Complications: No apparent anesthesia complications

## 2017-03-05 NOTE — Anesthesia Preprocedure Evaluation (Signed)
Anesthesia Evaluation  Patient identified by MRN, date of birth, ID band Patient awake    Reviewed: Allergy & Precautions, H&P , NPO status , Patient's Chart, lab work & pertinent test results  History of Anesthesia Complications (+) PONV, Family history of anesthesia reaction and history of anesthetic complications  Airway Mallampati: I  TM Distance: >3 FB Neck ROM: full    Dental  (+) Teeth Intact   Pulmonary neg pulmonary ROS, neg shortness of breath,    Pulmonary exam normal breath sounds clear to auscultation       Cardiovascular Exercise Tolerance: Good (-) angina(-) Past MI and (-) DOE negative cardio ROS Normal cardiovascular exam Rhythm:regular Rate:Normal     Neuro/Psych negative neurological ROS  negative psych ROS   GI/Hepatic negative GI ROS, Neg liver ROS, neg GERD  ,  Endo/Other  negative endocrine ROS  Renal/GU      Musculoskeletal   Abdominal   Peds  Hematology negative hematology ROS (+)   Anesthesia Other Findings Past Medical History: No date: Complication of anesthesia No date: Family history of adverse reaction to anesthes*     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 No date: Medical history non-contributory 09/2016: PONV (postoperative nausea and vomiting)     Comment: X 1 AFTER BTL   Past Surgical History: No date: APPENDECTOMY 09/25/2016: LAPAROSCOPIC TUBAL LIGATION Bilateral     Comment: Procedure: LAPAROSCOPIC TUBAL LIGATION;                Surgeon: Malachy Mood, MD;  Location: [facility]               ORS;  Service: Gynecology;  Laterality:               Bilateral; No date: TONSILLECTOMY 09/2016: TUBAL LIGATION No date: WISDOM TOOTH EXTRACTION     Reproductive/Obstetrics negative OB ROS                             Anesthesia Physical Anesthesia Plan  ASA: II  Anesthesia Plan: General LMA   Post-op Pain Management:    Induction:  Intravenous  Airway Management Planned: Oral ETT  Additional Equipment:   Intra-op Plan:   Post-operative Plan: Extubation in OR  Informed Consent: I have reviewed the patients History and Physical, chart, labs and discussed the procedure including the risks, benefits and alternatives for the proposed anesthesia with the patient or authorized representative who has indicated his/her understanding and acceptance.   Dental Advisory Given  Plan Discussed with: Anesthesiologist, CRNA and Surgeon  Anesthesia Plan Comments: (Patient consented for risks of anesthesia including but not limited to:  - adverse reactions to medications - damage to teeth, lips or other oral mucosa - sore throat or hoarseness - Damage to heart, brain, lungs or loss of life  Patient voiced understanding.)        Anesthesia Quick Evaluation

## 2017-03-05 NOTE — Op Note (Signed)
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  She understands that cryotherapy is a minor procedure with approximately a 85% success rate.  She also understands that cone biopsy is outpatient surgery with a 95% success rate and approximately a 5% recurrence rate.  She understands that this is surgery and has all the risks of any surgery including but not limited to the risks of anesthesia, hemorrhage, infection, perforation, injury to bowel, bladder and blood vessels.  She also understands the unlikely but potential effect on both getting pregnant and the ability of carrying a pregnancy.  Hysterectomy is the final option with a 99% success rate.  After careful discussion of all these options and her unique factors, mutual decision has been made to proceed with a cone biopsy.  Given her AIS findings on initial colposcopy biopsy, recommendation would be to proceed with hysterectomy if negative margins are obtained on cone biopsy.  She does not desire future fertility and has had her tubes tied.     Surgeon: Malachy Mood, MD  Anesthesia: General  Preoperative Antibiotics: none  Estimated Blood Loss: 5mL  IV Fluids: 536mL  Urine Output:: 72mL  Drains or Tubes: none  Implants: none  Specimens Removed: none  Complications: none  Intraoperative Findings: Normal appearing cervix, application of Lugol's revealed normal staining throughout the ectocervix, there were no non-staining area.  Top margin of the specimen was labeled with a suture at 12 O'Clock  Patient Condition: stable  Procedure in Detail:  Patient was taken to the operating room where she was administered general anesthesia.   She was positioned in the dorsal lithotomy position utilizing candy cane stirups, prepped and draped in the usual sterile fashion.  Prior to proceeding with procedure a time out was performed.  Attention was turned to the patient's pelvis.  A red rubber catheter was used to empty the patient's bladder.  An operative speculum was placed to allow visualization of the cervix.  Lugol's was applied to the cervix using a soft cotton swab noting the above mentioned staining pattern.  Stay sutures of 0 Vicryl were placed in the vaginal fornix at 10 and 3 O'Clock,  The cervix was injected with 36mL of lidocaine with epinephrine (1:1000).  A uterine sound was placed  delineate the cervical canal.The anterior lip of the cervix was grasped with a single tooth tenaculum, and a circumferential incision was scored in the ectocervix.  The edges of the specimen were grasped with an alice clamp avoiding the mucosal surfaces and the incision was carried cephalad circumferentially heading towards the uterine sound to obtain a deep cone given the AIS diagnosis.   After excision of the cone specimen endocervical curettage was obtained for the top margin of the specimen.  The cone bed was cauterized using a roller ball.  Sturmdorf sutures were placed.  Monsel solution was applied liberally followed by one sheet of fibrillar.  The Sturmdorf sutures were tied down followed by the stay sutures which were tied together in the midline to help hold the fibrillar in place.    Sponge needle and instrument counts were correct time two.  The patient tolerated the procedure well and was taken to the recovery room  in stable condition.

## 2017-03-05 NOTE — Anesthesia Post-op Follow-up Note (Cosign Needed)
Anesthesia QCDR form completed.        

## 2017-03-05 NOTE — H&P (Signed)
Date of Initial H&P: 02/25/2017  History reviewed, patient examined, no change in status, stable for surgery.

## 2017-03-05 NOTE — Discharge Instructions (Signed)

## 2017-03-06 ENCOUNTER — Encounter: Payer: Self-pay | Admitting: Obstetrics and Gynecology

## 2017-03-06 NOTE — Anesthesia Postprocedure Evaluation (Signed)
Anesthesia Post Note  Patient: Glass blower/designer  Procedure(s) Performed: Procedure(s) (LRB): CONIZATION CERVIX WITH BIOPSY (N/A)  Patient location during evaluation: PACU Anesthesia Type: General Level of consciousness: awake and alert Pain management: pain level controlled Vital Signs Assessment: post-procedure vital signs reviewed and stable Respiratory status: spontaneous breathing, nonlabored ventilation, respiratory function stable and patient connected to nasal cannula oxygen Cardiovascular status: blood pressure returned to baseline and stable Postop Assessment: no signs of nausea or vomiting Anesthetic complications: no     Last Vitals:  Vitals:   03/05/17 1404 03/05/17 1408  BP: 118/70 107/69  Pulse: 78   Resp: 20   Temp: 36.6 C     Last Pain:  Vitals:   03/05/17 1514  PainSc: 2                  Precious Haws Shaniece Bussa

## 2017-03-07 ENCOUNTER — Encounter: Payer: Self-pay | Admitting: Obstetrics and Gynecology

## 2017-03-07 LAB — SURGICAL PATHOLOGY

## 2017-03-11 ENCOUNTER — Encounter: Payer: Self-pay | Admitting: Obstetrics and Gynecology

## 2017-03-11 ENCOUNTER — Ambulatory Visit (INDEPENDENT_AMBULATORY_CARE_PROVIDER_SITE_OTHER): Payer: Medicaid Other | Admitting: Obstetrics and Gynecology

## 2017-03-11 VITALS — BP 116/76 | HR 103 | Wt 159.0 lb

## 2017-03-11 DIAGNOSIS — D069 Carcinoma in situ of cervix, unspecified: Secondary | ICD-10-CM

## 2017-03-11 DIAGNOSIS — Z09 Encounter for follow-up examination after completed treatment for conditions other than malignant neoplasm: Secondary | ICD-10-CM

## 2017-03-11 NOTE — Progress Notes (Signed)
      Postoperative Follow-up Patient presents post op from cold knife conization 1weeks ago for AIS and CIN II.  Subjective: Patient reports some improvement in her preop symptoms. Eating a regular diet without difficulty. The patient is not having any pain.  Activity: normal activities of daily living.  Had some heavier bleeding initially postop now resolved.  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 s/p CKC progressing well  Plan: Patient has done well after surgery with no apparent complications.  I have discussed the post-operative course to date, and the expected progress moving forward.  The patient understands what complications to be concerned about.  I will see the patient in routine follow up, or sooner if needed.    Activity plan: pelvic rest for full 6 weeks  Discussed proceeding with hysterectomy given menorrhagia and CIN III, although with no residual AIS demonstrated feel initial biopsy may have been over called.  Malachy Mood 03/11/2017, 9:54 AM

## 2017-04-08 ENCOUNTER — Telehealth: Payer: Self-pay

## 2017-04-08 NOTE — Telephone Encounter (Signed)
Pt calling for AMS.  She had cone bx 03/05/17 and is still bleeding and passing more clots than usual.  Is this normal? She is hoping to maybe give AMS a heads up in case there is something he can don at her 6wk appt. [phone]

## 2017-04-08 NOTE — Telephone Encounter (Signed)
Please advise. I think this could possibly be her cycle.

## 2017-04-09 NOTE — Telephone Encounter (Signed)
Yes if she is not still on provera it is likely a cycle.

## 2017-04-09 NOTE — Telephone Encounter (Signed)
Pt states she is still taking the Provera. She prefers to speak to you since she has questions. KJ CMA

## 2017-04-16 ENCOUNTER — Ambulatory Visit (INDEPENDENT_AMBULATORY_CARE_PROVIDER_SITE_OTHER): Payer: Medicaid Other | Admitting: Obstetrics and Gynecology

## 2017-04-16 ENCOUNTER — Encounter: Payer: Self-pay | Admitting: Obstetrics and Gynecology

## 2017-04-16 VITALS — BP 132/78 | HR 70 | Wt 161.0 lb

## 2017-04-16 DIAGNOSIS — Z4889 Encounter for other specified surgical aftercare: Secondary | ICD-10-CM

## 2017-04-16 NOTE — Progress Notes (Signed)
      Postoperative Follow-up Patient presents post op from CKC 6weeks ago for AIS.  Subjective: Patient reports some improvement in her preop symptoms. Eating a regular diet without difficulty. The patient is not having any pain.  Activity: normal activities of daily living.  Still having some vaginal bleeding  Objective: Vitals:   04/16/17 1342  BP: 132/78  Pulse: 70   Gen: NAD Normal external female genital, normal urethral meatus, bartholin's and skeen's glands.  Minimal residual cervix almost flush with vaginal wall.  Cervical os patent, minimal bleeding.  Monsel applied to inside of cervical os Ext no edema  Assessment: 26 y.o. s/p CKC stable  Plan: Patient has done well after surgery with no apparent complications.  I have discussed the post-operative course to date, and the expected progress moving forward.  The patient understands what complications to be concerned about.  I will see the patient in routine follow up, or sooner if needed.    Activity plan: No restriction.  We discussed management option give AIS diagnosis, no residual AIS on CKC and clean margins.  Given s/p BTL and not desiring future fertility we discussed recommendation of hysterectomy, with more conservative approach repeat colposcopy in 6 months.  Patient opts to proceed with hysterectomy, will post in 2-3 weeks.  Patient opts for definitive surgical management via hysterectomy. The risks of surgery were discussed in detail with the patient including but not limited to: bleeding which may require transfusion or reoperation; infection which may require antibiotics; injury to bowel, bladder, ureters or other surrounding organs (With a literature reported rate of urinary tract injury of 1% quoted); need for additional procedures including laparotomy; thromboembolic phenomenon, incisional problems and other postoperative/anesthesia complications.  Patient was also advised that recovery procedure generally  involves an overnight stay; and the  expected recovery time after a hysterectomy being in the range of 6-8 weeks.  Likelihood of success in alleviating the patient's symptoms was discussed.  While definitive in regards to issues with menstural bleeding, pelvic pain if present preoperatively may continue and in fact worsen postoperatively.  She is aware that the procedure will render her unable to pursue childbearing in the future.   She was told that she will be contacted by our surgical scheduler regarding the time and date of her surgery; routine preoperative instructions of having nothing to eat or drink after midnight on the day prior to surgery and also coming to the hospital 1.5 hours prior to her time of surgery were also emphasized.  She was told she may be called for a preoperative appointment about a week prior to surgery and will be given further preoperative instructions at that visit.  Routine postoperative instructions will be reviewed with the patient and her family in detail after surgery. Printed patient education handouts about the procedure was given to the patient to review at home.   Stefanie Gomez 04/16/2017, 2:09 PM

## 2017-04-17 ENCOUNTER — Telehealth: Payer: Self-pay | Admitting: Obstetrics and Gynecology

## 2017-04-17 NOTE — Telephone Encounter (Signed)
Patient called back to say her husband is on-call on 05/07/16. Surgery moved to 05/21/17.

## 2017-04-17 NOTE — Telephone Encounter (Signed)
Patient is aware of OR on 05/07/17 and that she will receive a phone call from [place] prior to surgery. Patient is aware Dr Georgianne Fick will do H&P and consents day of surgery.

## 2017-04-17 NOTE — Telephone Encounter (Signed)
-----   Message from Malachy Mood, MD sent at 04/16/2017  2:11 PM EDT ----- Regarding: Surgery Surgery Date: 2-3 weeks  LOS: inpatient  Surgery Booking Request Patient Full Name: Stefanie Gomez, Stefanie Gomez MRN: [id number]  DOB: 26-Nov-1990  Surgeon: Malachy Mood, MD  Requested Surgery Date and Time: 2-3 weeks Primary Diagnosis and Code: Adenocarcinoma in situ Secondary Diagnosis and Code:  Surgical Procedure: TLH/BS and cystoscopy L&D Notification:N/A Admission Status: observation Length of Surgery: 2hrs Special Case Needs: none H&P: (date) Phone Interview or Office Pre-Admit: phone Interpreter: none Language: English Medical Clearance: none Special Scheduling Instructions: This is another one that we can schedule and I can do the H&P and consent day of surgery

## 2017-04-29 ENCOUNTER — Encounter
Admission: RE | Admit: 2017-04-29 | Discharge: 2017-04-29 | Disposition: A | Discharge status: Home / Self Care | Payer: Medicaid Other | Source: Ambulatory Visit | Attending: Obstetrics and Gynecology | Admitting: Obstetrics and Gynecology

## 2017-04-29 NOTE — Patient Instructions (Signed)
  Your procedure is scheduled on: 05-07-17 TUESDAY Report to Same Day Surgery 2nd floor medical mall [facility] Entrance-take elevator on left to 2nd floor.  Check in with surgery information desk.) To find out your arrival time please call [phone] between 1PM - 3PM on 05-06-17 MONDAY  Remember: Instructions that are not followed completely may result in serious medical risk, up to and including death, or upon the discretion of your surgeon and anesthesiologist your surgery may need to be rescheduled.    _x___ 1. Do not eat food or drink liquids after midnight. No gum chewing or hard candies.     __x__ 2. No Alcohol for 24 hours before or after surgery.   __x__3. No Smoking for 24 prior to surgery.   ____  4. Bring all medications with you on the day of surgery if instructed.    __x__ 5. Notify your doctor if there is any change in your medical condition     (cold, fever, infections).     Do not wear jewelry, make-up, hairpins, clips or nail polish.  Do not wear lotions, powders, or perfumes. You may wear deodorant.  Do not shave 48 hours prior to surgery. Men may shave face and neck.  Do not bring valuables to the hospital.    [facility] is not responsible for any belongings or valuables.               Contacts, dentures or bridgework may not be worn into surgery.  Leave your suitcase in the car. After surgery it may be brought to your room.  For patients admitted to the hospital, discharge time is determined by your treatment team.   Patients discharged the day of surgery will not be allowed to drive home.  You will need someone to drive you home and stay with you the night of your procedure.    Please read over the following fact sheets that you were given:    ____ Take anti-hypertensive (unless it includes a diuretic), cardiac, seizure, asthma,     anti-reflux and psychiatric medicines. These include:  1. NONE  2.  3.  4.  5.  6.  ____Fleets enema or Magnesium  Citrate as directed.   _x___ Use CHG Soap or sage wipes as directed on instruction sheet   ____ Use inhalers on the day of surgery and bring to hospital day of surgery  ____ Stop Metformin and Janumet 2 days prior to surgery.    ____ Take 1/2 of usual insulin dose the night before surgery and none on the morning surgery.   ____ Follow recommendations from Cardiologist, Pulmonologist or PCP regarding stopping Aspirin, Coumadin, Pllavix ,Eliquis, Effient, or Pradaxa, and Pletal.  X____Stop Anti-inflammatories such as Advil, Aleve, Ibuprofen, Motrin, Naproxen, Naprosyn, Goodies powders or aspirin products NOW-OK to take Tylenol    ____ Stop supplements until after surgery.   ____ Bring C-Pap to the hospital.

## 2017-04-30 ENCOUNTER — Encounter
Admission: RE | Admit: 2017-04-30 | Discharge: 2017-04-30 | Disposition: A | Discharge status: Home / Self Care | Payer: Medicaid Other | Source: Ambulatory Visit | Attending: Obstetrics and Gynecology | Admitting: Obstetrics and Gynecology

## 2017-04-30 DIAGNOSIS — C801 Malignant (primary) neoplasm, unspecified: Secondary | ICD-10-CM | POA: Diagnosis not present

## 2017-04-30 DIAGNOSIS — Z0183 Encounter for blood typing: Secondary | ICD-10-CM | POA: Diagnosis not present

## 2017-04-30 DIAGNOSIS — Z01812 Encounter for preprocedural laboratory examination: Secondary | ICD-10-CM | POA: Insufficient documentation

## 2017-04-30 LAB — BASIC METABOLIC PANEL
Anion gap: 6 (ref 5–15)
BUN: 10 mg/dL (ref 6–20)
CHLORIDE: 107 mmol/L (ref 101–111)
CO2: 25 mmol/L (ref 22–32)
Calcium: 9.5 mg/dL (ref 8.9–10.3)
Creatinine, Ser: 0.89 mg/dL (ref 0.44–1.00)
GFR calc non Af Amer: >60 mL/min (ref >60)
GLUCOSE: 81 mg/dL (ref 65–99)
POTASSIUM: 3.6 mmol/L (ref 3.5–5.1)
Sodium: 138 mmol/L (ref 135–145)

## 2017-04-30 LAB — CBC
HEMATOCRIT: 39.6 % (ref 35.0–47.0)
HEMOGLOBIN: 13.4 g/dL (ref 12.0–16.0)
MCH: 28.5 pg (ref 26.0–34.0)
MCHC: 33.8 g/dL (ref 32.0–36.0)
MCV: 84.3 fL (ref 80.0–100.0)
Platelets: 186 10*3/uL (ref 150–440)
RBC: 4.7 MIL/uL (ref 3.80–5.20)
RDW: 13.5 % (ref 11.5–14.5)
WBC: 5.4 10*3/uL (ref 3.6–11.0)

## 2017-04-30 LAB — TYPE AND SCREEN
ABO/RH(D): O POS
Antibody Screen: NEGATIVE

## 2017-05-06 MED ORDER — CEFAZOLIN SODIUM-DEXTROSE 2-4 GM/100ML-% IV SOLN
2.0000 g | INTRAVENOUS | Status: AC
  Administered 2017-05-07: 2 g via INTRAVENOUS

## 2017-05-07 ENCOUNTER — Observation Stay
Admission: RE | Admit: 2017-05-07 | Discharge: 2017-05-08 | Disposition: A | Discharge status: Home / Self Care | Payer: Medicaid Other | Source: Ambulatory Visit | Attending: Obstetrics and Gynecology | Admitting: Obstetrics and Gynecology

## 2017-05-07 ENCOUNTER — Encounter
Admission: RE | Disposition: A | Discharge status: Home / Self Care | Payer: Self-pay | Source: Ambulatory Visit | Attending: Obstetrics and Gynecology

## 2017-05-07 ENCOUNTER — Encounter: Payer: Self-pay | Admitting: *Deleted

## 2017-05-07 ENCOUNTER — Ambulatory Visit: Payer: Medicaid Other | Admitting: Anesthesiology

## 2017-05-07 DIAGNOSIS — Z793 Long term (current) use of hormonal contraceptives: Secondary | ICD-10-CM | POA: Insufficient documentation

## 2017-05-07 DIAGNOSIS — D067 Carcinoma in situ of other parts of cervix: Secondary | ICD-10-CM | POA: Diagnosis not present

## 2017-05-07 DIAGNOSIS — Z881 Allergy status to other antibiotic agents status: Secondary | ICD-10-CM | POA: Diagnosis not present

## 2017-05-07 DIAGNOSIS — Z90710 Acquired absence of both cervix and uterus: Secondary | ICD-10-CM | POA: Diagnosis present

## 2017-05-07 DIAGNOSIS — C539 Malignant neoplasm of cervix uteri, unspecified: Principal | ICD-10-CM | POA: Insufficient documentation

## 2017-05-07 DIAGNOSIS — N920 Excessive and frequent menstruation with regular cycle: Secondary | ICD-10-CM | POA: Diagnosis not present

## 2017-05-07 DIAGNOSIS — N871 Moderate cervical dysplasia: Secondary | ICD-10-CM | POA: Diagnosis not present

## 2017-05-07 HISTORY — PX: LAPAROSCOPIC HYSTERECTOMY: SHX1926

## 2017-05-07 HISTORY — PX: CYSTOSCOPY: SHX5120

## 2017-05-07 LAB — POCT PREGNANCY, URINE: Preg Test, Ur: NEGATIVE

## 2017-05-07 SURGERY — HYSTERECTOMY, TOTAL, LAPAROSCOPIC
Anesthesia: General | Site: Abdomen | Wound class: Clean Contaminated

## 2017-05-07 MED ORDER — ONDANSETRON HCL 4 MG/2ML IJ SOLN: 4.0000 mg | Freq: Once | INTRAMUSCULAR | Status: DC | PRN

## 2017-05-07 MED ORDER — SCOPOLAMINE 1 MG/3DAYS TD PT72
MEDICATED_PATCH | TRANSDERMAL | Status: AC
  Administered 2017-05-07: 1.5 mg via TRANSDERMAL
  Filled 2017-05-07: qty 1

## 2017-05-07 MED ORDER — ACETAMINOPHEN 10 MG/ML IV SOLN
INTRAVENOUS | Status: AC
  Filled 2017-05-07: qty 100

## 2017-05-07 MED ORDER — SCOPOLAMINE 1 MG/3DAYS TD PT72
1.0000 | MEDICATED_PATCH | Freq: Once | TRANSDERMAL | Status: AC
  Administered 2017-05-07: 1 via TRANSDERMAL
  Administered 2017-05-07: 1.5 mg via TRANSDERMAL

## 2017-05-07 MED ORDER — GLYCOPYRROLATE 0.2 MG/ML IJ SOLN
INTRAMUSCULAR | Status: DC | PRN
  Administered 2017-05-07: 0.2 mg via INTRAVENOUS

## 2017-05-07 MED ORDER — FAMOTIDINE 20 MG PO TABS
ORAL_TABLET | ORAL | Status: AC
  Filled 2017-05-07: qty 1

## 2017-05-07 MED ORDER — PROPOFOL 500 MG/50ML IV EMUL
INTRAVENOUS | Status: AC
  Filled 2017-05-07: qty 50

## 2017-05-07 MED ORDER — CEFAZOLIN SODIUM-DEXTROSE 1-4 GM/50ML-% IV SOLN
INTRAVENOUS | Status: AC
  Filled 2017-05-07: qty 50

## 2017-05-07 MED ORDER — KETOROLAC TROMETHAMINE 30 MG/ML IJ SOLN: 30.0000 mg | Freq: Four times a day (QID) | INTRAMUSCULAR | Status: DC

## 2017-05-07 MED ORDER — PROPOFOL 10 MG/ML IV BOLUS
INTRAVENOUS | Status: DC | PRN
  Administered 2017-05-07: 150 mg via INTRAVENOUS
  Administered 2017-05-07: 20 mg via INTRAVENOUS
  Administered 2017-05-07: 30 mg via INTRAVENOUS

## 2017-05-07 MED ORDER — FAMOTIDINE 20 MG PO TABS
20.0000 mg | ORAL_TABLET | Freq: Once | ORAL | Status: AC
  Administered 2017-05-07: 20 mg via ORAL

## 2017-05-07 MED ORDER — MORPHINE SULFATE (PF) 2 MG/ML IV SOLN: 1.0000 mg | INTRAVENOUS | Status: DC | PRN

## 2017-05-07 MED ORDER — PHENYLEPHRINE HCL 10 MG/ML IJ SOLN
INTRAMUSCULAR | Status: DC | PRN
  Administered 2017-05-07: 100 ug via INTRAVENOUS

## 2017-05-07 MED ORDER — DEXAMETHASONE SODIUM PHOSPHATE 10 MG/ML IJ SOLN
INTRAMUSCULAR | Status: AC
  Filled 2017-05-07: qty 1

## 2017-05-07 MED ORDER — MENTHOL 3 MG MT LOZG
1.0000 | LOZENGE | OROMUCOSAL | Status: DC | PRN
  Filled 2017-05-07: qty 9

## 2017-05-07 MED ORDER — ROCURONIUM BROMIDE 50 MG/5ML IV SOLN
INTRAVENOUS | Status: AC
  Filled 2017-05-07: qty 2

## 2017-05-07 MED ORDER — FENTANYL CITRATE (PF) 100 MCG/2ML IJ SOLN
25.0000 ug | INTRAMUSCULAR | Status: DC | PRN
  Administered 2017-05-07 (×4): 25 ug via INTRAVENOUS

## 2017-05-07 MED ORDER — LACTATED RINGERS IV SOLN
INTRAVENOUS | Status: DC
  Administered 2017-05-07 (×2): via INTRAVENOUS

## 2017-05-07 MED ORDER — ACETAMINOPHEN 10 MG/ML IV SOLN
INTRAVENOUS | Status: DC | PRN
  Administered 2017-05-07: 1000 mg via INTRAVENOUS

## 2017-05-07 MED ORDER — SUGAMMADEX SODIUM 200 MG/2ML IV SOLN
INTRAVENOUS | Status: AC
  Filled 2017-05-07: qty 2

## 2017-05-07 MED ORDER — BUPIVACAINE HCL (PF) 0.5 % IJ SOLN
INTRAMUSCULAR | Status: AC
  Filled 2017-05-07: qty 30

## 2017-05-07 MED ORDER — FENTANYL CITRATE (PF) 100 MCG/2ML IJ SOLN
INTRAMUSCULAR | Status: AC
  Filled 2017-05-07: qty 2

## 2017-05-07 MED ORDER — LIDOCAINE HCL (CARDIAC) 20 MG/ML IV SOLN
INTRAVENOUS | Status: DC | PRN
  Administered 2017-05-07: 100 mg via INTRAVENOUS

## 2017-05-07 MED ORDER — KETOROLAC TROMETHAMINE 30 MG/ML IJ SOLN
30.0000 mg | Freq: Four times a day (QID) | INTRAMUSCULAR | Status: DC
  Administered 2017-05-07 – 2017-05-08 (×4): 30 mg via INTRAVENOUS
  Filled 2017-05-07 (×4): qty 1

## 2017-05-07 MED ORDER — FENTANYL CITRATE (PF) 100 MCG/2ML IJ SOLN
INTRAMUSCULAR | Status: DC | PRN
  Administered 2017-05-07 (×2): 50 ug via INTRAVENOUS

## 2017-05-07 MED ORDER — PROPOFOL 500 MG/50ML IV EMUL
INTRAVENOUS | Status: DC | PRN
  Administered 2017-05-07: 100 ug/kg/min via INTRAVENOUS

## 2017-05-07 MED ORDER — MIDAZOLAM HCL 2 MG/2ML IJ SOLN
INTRAMUSCULAR | Status: AC
  Filled 2017-05-07: qty 2

## 2017-05-07 MED ORDER — DEXTROSE-NACL 5-0.45 % IV SOLN
INTRAVENOUS | Status: DC
  Administered 2017-05-07 – 2017-05-08 (×3): via INTRAVENOUS

## 2017-05-07 MED ORDER — ROCURONIUM BROMIDE 100 MG/10ML IV SOLN
INTRAVENOUS | Status: DC | PRN
  Administered 2017-05-07: 20 mg via INTRAVENOUS
  Administered 2017-05-07: 30 mg via INTRAVENOUS
  Administered 2017-05-07: 20 mg via INTRAVENOUS

## 2017-05-07 MED ORDER — ONDANSETRON HCL 4 MG/2ML IJ SOLN
INTRAMUSCULAR | Status: DC | PRN
  Administered 2017-05-07: 4 mg via INTRAVENOUS

## 2017-05-07 MED ORDER — LIDOCAINE HCL (PF) 2 % IJ SOLN
INTRAMUSCULAR | Status: AC
  Filled 2017-05-07: qty 2

## 2017-05-07 MED ORDER — DEXAMETHASONE SODIUM PHOSPHATE 10 MG/ML IJ SOLN
INTRAMUSCULAR | Status: DC | PRN
  Administered 2017-05-07: 10 mg via INTRAVENOUS

## 2017-05-07 MED ORDER — MIDAZOLAM HCL 2 MG/2ML IJ SOLN
INTRAMUSCULAR | Status: DC | PRN
  Administered 2017-05-07: 2 mg via INTRAVENOUS

## 2017-05-07 MED ORDER — ONDANSETRON HCL 4 MG/2ML IJ SOLN
INTRAMUSCULAR | Status: AC
  Filled 2017-05-07: qty 2

## 2017-05-07 MED ORDER — BUPIVACAINE HCL 0.5 % IJ SOLN
INTRAMUSCULAR | Status: DC | PRN
  Administered 2017-05-07: 17 mL

## 2017-05-07 MED ORDER — OXYCODONE-ACETAMINOPHEN 5-325 MG PO TABS
1.0000 | ORAL_TABLET | ORAL | Status: DC | PRN
  Administered 2017-05-07 (×2): 2 via ORAL
  Filled 2017-05-07 (×2): qty 2

## 2017-05-07 MED ORDER — SUGAMMADEX SODIUM 200 MG/2ML IV SOLN
INTRAVENOUS | Status: DC | PRN
  Administered 2017-05-07: 150 mg via INTRAVENOUS

## 2017-05-07 SURGICAL SUPPLY — 50 items
BAG URO DRAIN 2000ML W/SPOUT (MISCELLANEOUS) ×4 IMPLANT
BLADE SURG SZ11 CARB STEEL (BLADE) ×4 IMPLANT
CANISTER SUCT 1200ML W/VALVE (MISCELLANEOUS) ×4 IMPLANT
CATH FOLEY 2WAY  5CC 16FR (CATHETERS) ×2
CATH ROBINSON RED A/P 16FR (CATHETERS) ×4 IMPLANT
CATH URTH 16FR FL 2W BLN LF (CATHETERS) ×2 IMPLANT
CHLORAPREP W/TINT 26ML (MISCELLANEOUS) ×4 IMPLANT
COUNTER NEEDLE 20/40 LG (NEEDLE) ×4 IMPLANT
DEFOGGER SCOPE WARMER CLEARIFY (MISCELLANEOUS) ×4 IMPLANT
DERMABOND ADVANCED (GAUZE/BANDAGES/DRESSINGS) ×2
DERMABOND ADVANCED .7 DNX12 (GAUZE/BANDAGES/DRESSINGS) ×2 IMPLANT
DRAPE UNDER BUTTOCK W/FLU (DRAPES) ×4 IMPLANT
GLOVE BIO SURGEON STRL SZ7 (GLOVE) ×16 IMPLANT
GLOVE INDICATOR 7.5 STRL GRN (GLOVE) ×4 IMPLANT
GOWN STRL REUS W/ TWL LRG LVL3 (GOWN DISPOSABLE) ×4 IMPLANT
GOWN STRL REUS W/ TWL XL LVL3 (GOWN DISPOSABLE) ×2 IMPLANT
GOWN STRL REUS W/TWL LRG LVL3 (GOWN DISPOSABLE) ×4
GOWN STRL REUS W/TWL XL LVL3 (GOWN DISPOSABLE) ×2
IRRIGATION STRYKERFLOW (MISCELLANEOUS) ×2 IMPLANT
IRRIGATOR STRYKERFLOW (MISCELLANEOUS) ×4
IV LACTATED RINGERS 1000ML (IV SOLUTION) ×4 IMPLANT
IV NS 1000ML (IV SOLUTION) ×2
IV NS 1000ML BAXH (IV SOLUTION) ×2 IMPLANT
KIT PINK PAD W/HEAD ARE REST (MISCELLANEOUS) ×4
KIT PINK PAD W/HEAD ARM REST (MISCELLANEOUS) ×2 IMPLANT
LABEL OR SOLS (LABEL) ×4 IMPLANT
MANIPULATOR VCARE LG CRV RETR (MISCELLANEOUS) ×4 IMPLANT
MANIPULATOR VCARE SML CRV RETR (MISCELLANEOUS) IMPLANT
MANIPULATOR VCARE STD CRV RETR (MISCELLANEOUS) IMPLANT
NS IRRIG 1000ML POUR BTL (IV SOLUTION) ×4 IMPLANT
NS IRRIG 500ML POUR BTL (IV SOLUTION) ×4 IMPLANT
OCCLUDER COLPOPNEUMO (BALLOONS) IMPLANT
PACK GYN LAPAROSCOPIC (MISCELLANEOUS) ×4 IMPLANT
PAD OB MATERNITY 4.3X12.25 (PERSONAL CARE ITEMS) ×4 IMPLANT
PAD PREP 24X41 OB/GYN DISP (PERSONAL CARE ITEMS) ×4 IMPLANT
SET CYSTO W/LG BORE CLAMP LF (SET/KITS/TRAYS/PACK) ×4 IMPLANT
SHEARS HARMONIC ACE PLUS 36CM (ENDOMECHANICALS) ×4 IMPLANT
SLEEVE ENDOPATH XCEL 5M (ENDOMECHANICALS) ×8 IMPLANT
SPONGE LAP 18X18 5 PK (GAUZE/BANDAGES/DRESSINGS) ×4 IMPLANT
SPONGE XRAY 4X4 16PLY STRL (MISCELLANEOUS) ×4 IMPLANT
STRAP SAFETY BODY (MISCELLANEOUS) ×4 IMPLANT
SURGILUBE 2OZ TUBE FLIPTOP (MISCELLANEOUS) ×4 IMPLANT
SUT VIC AB 0 CT1 27 (SUTURE) ×2
SUT VIC AB 0 CT1 27XCR 8 STRN (SUTURE) ×2 IMPLANT
SUT VIC AB 2-0 UR6 27 (SUTURE) ×4 IMPLANT
SUT VIC AB 4-0 FS2 27 (SUTURE) ×8 IMPLANT
SYR 50ML LL SCALE MARK (SYRINGE) ×4 IMPLANT
SYRINGE 10CC LL (SYRINGE) ×4 IMPLANT
TROCAR XCEL NON-BLD 5MMX100MML (ENDOMECHANICALS) ×4 IMPLANT
TUBING INSUF HEATED (TUBING) ×4 IMPLANT

## 2017-05-07 NOTE — Anesthesia Preprocedure Evaluation (Signed)
Anesthesia Evaluation  Patient identified by MRN, date of birth, ID band Patient awake    Reviewed: Allergy & Precautions, NPO status , Patient's Chart, lab work & pertinent test results, reviewed documented beta blocker date and time   History of Anesthesia Complications (+) PONV, Family history of anesthesia reaction and history of anesthetic complications  Airway Mallampati: II  TM Distance: >3 FB     Dental  (+) Chipped   Pulmonary           Cardiovascular      Neuro/Psych    GI/Hepatic   Endo/Other    Renal/GU      Musculoskeletal   Abdominal   Peds  Hematology   Anesthesia Other Findings   Reproductive/Obstetrics                             Anesthesia Physical Anesthesia Plan  ASA: II  Anesthesia Plan: General   Post-op Pain Management:    Induction: Intravenous  PONV Risk Score and Plan:   Airway Management Planned: Oral ETT  Additional Equipment:   Intra-op Plan:   Post-operative Plan:   Informed Consent: I have reviewed the patients History and Physical, chart, labs and discussed the procedure including the risks, benefits and alternatives for the proposed anesthesia with the patient or authorized representative who has indicated his/her understanding and acceptance.     Plan Discussed with: CRNA  Anesthesia Plan Comments:         Anesthesia Quick Evaluation

## 2017-05-07 NOTE — H&P (Signed)
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 with adenocarcinoma in situ on colposcopy 2) LEEP 03/05/17 clear margins no residual adenocarcinoma in situ, CIN III  Operation Performed: Total laparoscopic hysterectomy, bilateral salpingectomy, and cystoscopy  Indication: Adenocarcinoma in situ  Surgeon: Malachy Mood, MD  Anesthesia: General  Preoperative Antibiotics: none  Estimated Blood Loss: 116mL  IV Fluids:1L crystaloid  Urine Output:: 1L   Drains or Tubes: none  Implants: none  Specimens Removed: Uterus, cervix, and bilateral fallopian tubes  Complications: none  Intraoperative Findings: Postsurgical LEEP changes on cervix, normal uterus, tubes, and ovaries.    Patient Condition: stable  Procedure in Detail:  Patient was taken to the operating room where she was administered general anesthesia.  She was positioned in the dorsal lithotomy position utilizing Allen stirups, prepped and draped in the usual sterile fashion.  Prior to proceeding with procedure a time out was performed.  Attention was turned to the patient's pelvis.  An indwelling foley catheter was placed to decompress the patient's bladder.  An operative speculum was placed to allow visualization of the cervix.  The anterior lip of the cervix was grasped with a single tooth tenaculum, and a large V-care uterine manipulator was placed to allow manipulation of the uterus.  The operative speculum and single tooth tenaculum were then removed.  Attention was turned to the patient's abdomen.  The umbilicus was infiltrated with 1% Sensorcaine, before making a stab incision using an 11 blade scalpel.  A 48mm Excel trocar was then used to gain direct entry into the peritoneal cavity utilizing the camera to visualize progress of the trocar during placement.  Once peritoneal entry  had been achieved, insufflation was started and pneumoperitoneum established at a pressure of 56mmHg.    One left and one right lower quadrant site were then injected with 1% Sensorcaine and a stab incision was made using an 11 blade scalpel.  Two additional 15mm Excel trocars were placed through these incisions under direct visualization. General inspection of the abdomen revealed the above noted findings.   The right tube was identified and grasped at its fimbriated end.  The tube was transected from its attachments to the ovary and mesosalpinx using a 14mm Harmonic scalpel.  The utero ovarian ligament was identified ligated and transected using the Harmonic scalpel. The round ligament was then likewise ligated and transected.  The anterior leaf of the broad ligament was dissected down to the level of the internal cervical os and a bladder flap was started.  The posterior leaf of the broad ligament was dissected down to the utero-sacral ligament.  The uterine artery was skeletonized before being ligated and transected using the Harmonic scalpel with cephelad pressure applied to the V-care device to assure lateralization of the ureter.  A bite was then taken with Harmonic medial to transected portio of uterine artery to further lateralize the ureter and vessel off the V-care cup.  The patient left adnexal structures were then dissected in similar fashion.  The bladder flap was completed and the bladder mobilized off the V-care cup.  An anterior colpotomy was scores and carried around in a clockwise fashion to free the specimen, which was then removed vaginally.  Inspection revealed all pedicles to be hemostatic before proceed with vaginal closure.  Attention was turned to the patient pelvis.  An operative speculum was placed and the anterior and posterior  portion of the vaginal cuff were tagged with long Alice clamps.  The cuff was closed using interrupted figure of eight stiches of 0 Vicryl.  The cuff was  hemsotatic at the conclusion of closure without visible or palpable defects.  The indwelling foley catheter was removed.  Cystoscopy was performed noting and intact bladder dome as well as brisk efflux of urine from bother ureteral orifices.  The cystoscopy was removed and the indwelling foley catheter was replaced.  Pneumoperitoneum was re-established, the pelvis was irrigated and all pedicles were once again inspected and noted to be hemostatic.  The cuff appeared well closed and free of bowl or other tissue that may have inadvertently been incorporated into the vaginal closure.  Additional hemostatic agents were not applied.      Pneumoperitoneum was evacuated and trocars were removed.  All trocar sites were then dressed with surgical skin glue.  Sponge needle and instrument counts were correct time two.  The patient tolerated the procedure well and was taken to the recovery room in stable condition.

## 2017-05-07 NOTE — Transfer of Care (Signed)
Immediate Anesthesia Transfer of Care Note  Patient: Stefanie Gomez  Procedure(s) Performed: Procedure(s): HYSTERECTOMY TOTAL LAPAROSCOPIC BILATERAL SALPINGECTOMY (Bilateral) CYSTOSCOPY (N/A)  Patient Location: PACU  Anesthesia Type:General  Level of Consciousness: sedated  Airway & Oxygen Therapy: Patient connected to face mask oxygen  Post-op Assessment: Post -op Vital signs reviewed and stable  Post vital signs: stable  Last Vitals:  Vitals:   05/07/17 1246  BP: 100/64  Pulse: 62  Resp: (!) 25  Temp: 36.6 C    Last Pain:  Vitals:   05/07/17 1246  TempSrc: Temporal         Complications: No apparent anesthesia complications

## 2017-05-07 NOTE — Anesthesia Procedure Notes (Signed)
Procedure Name: Intubation Date/Time: 05/07/2017 10:43 AM Performed by: Aline Brochure Pre-anesthesia Checklist: Patient identified, Emergency Drugs available, Suction available and Patient being monitored Patient Re-evaluated:Patient Re-evaluated prior to inductionOxygen Delivery Method: Circle system utilized Preoxygenation: Pre-oxygenation with 100% oxygen Intubation Type: IV induction Ventilation: Mask ventilation without difficulty Laryngoscope Size: Mac and 3 Grade View: Grade I Tube type: Oral Tube size: 7.0 mm Number of attempts: 1 Airway Equipment and Method: Stylet Placement Confirmation: ETT inserted through vocal cords under direct vision,  positive ETCO2 and breath sounds checked- equal and bilateral Secured at: 21 cm Tube secured with: Tape Dental Injury: Teeth and Oropharynx as per pre-operative assessment

## 2017-05-07 NOTE — Anesthesia Post-op Follow-up Note (Cosign Needed)
Anesthesia QCDR form completed.        

## 2017-05-07 NOTE — H&P (Signed)
Obstetrics & Gynecology Surgery H&P    Chief Complaint: Scheduled Surgery   History of Present Illness: Patient is a 26 y.o. 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 Complication of anesthesia   . Family history of adverse reaction to anesthesia    SISTER-HARD TIME WAKING UP  . Medical history non-contributory   . PONV (postoperative nausea and vomiting) 09/2016   X 1 AFTER BTL    Past Surgical History:  Past Surgical History:  Procedure Laterality Date  . APPENDECTOMY    . CERVICAL CONE BIOPSY  03/05/2017   [facility]  . CERVICAL CONIZATION W/BX N/A 03/05/2017   Procedure: CONIZATION CERVIX WITH BIOPSY;  Surgeon: Malachy Mood, MD;  Location: [facility] ORS;  Service: Gynecology;  Laterality: N/A;  . LAPAROSCOPIC TUBAL LIGATION Bilateral 09/25/2016   Procedure: LAPAROSCOPIC TUBAL LIGATION;  Surgeon: Malachy Mood, MD;  Location: [facility] ORS;  Service: Gynecology;  Laterality: Bilateral;  . TONSILLECTOMY    . TUBAL LIGATION  09/2016  . WISDOM TOOTH EXTRACTION      Family History:  Family History  Problem Relation Age of Onset  . Colon cancer Father 38  . Lung cancer Father   . Rectal cancer Father     Social History:  Social History   Social History  . Marital status: Single    Spouse name: N/A  . Number of children: N/A  . Years of education: N/A   Occupational History  . Not on file.   Social History Main Topics  . Smoking status: Never Smoker  . Smokeless tobacco: Never Used  . Alcohol use No  . Drug use: No  . Sexual activity: Yes    Birth control/ protection: Surgical     Comment: BTL   Other Topics Concern  . Not on  file   Social History Narrative  . No narrative on file    Allergies:  Allergies  Allergen Reactions  . Bactericin [Bacitracin] Rash    Medications: Prior to Admission medications   Medication Sig Start Date End Date Taking? Authorizing Provider  medroxyPROGESTERone (PROVERA) 10 MG tablet Take 1 tablet (10 mg total) by mouth daily. 02/25/17  Yes Malachy Mood, MD    Physical Exam Vitals: Last menstrual period 02/04/2017, not currently breastfeeding. General: NAD HEENT: normocephalic, anicteric Pulmonary: CTAB, No increased work of breathing Cardiovascular: RRR, distal pulses 2+ Abdomen: soft, non-tender, non-distended Extremities: no edema, erythema, or tenderness Neurologic: Grossly intact Psychiatric: mood appropriate, affect full  Imaging No results found.  Assessment: 26 y.o. [id number] presenting for scheduled TLH, BS and cystoscopy for AIS  Plan: 1) Patient opts for definitive surgical management via hysterectomy. The risks of surgery were discussed in detail with the patient including but not limited to: bleeding which may require transfusion or reoperation; infection which may require antibiotics; injury to bowel, bladder, ureters or other surrounding organs (With a literature reported rate of urinary tract injury of 1% quoted); need for additional procedures including laparotomy; thromboembolic phenomenon, incisional problems and other postoperative/anesthesia complications.  Patient was also advised that recovery procedure generally involves an overnight stay; and the  expected recovery time after a hysterectomy being in the range of 6-8  weeks.  Likelihood of success in alleviating the patient's symptoms was discussed.  While definitive in regards to issues with menstural bleeding, pelvic pain if present preoperatively may continue and in fact worsen postoperatively.  She is aware that the procedure will render her unable to pursue childbearing in the future.   She was  told that she will be contacted by our surgical scheduler regarding the time and date of her surgery; routine preoperative instructions of having nothing to eat or drink after midnight on the day prior to surgery and also coming to the hospital 1.5 hours prior to her time of surgery were also emphasized.  She was told she may be called for a preoperative appointment about a week prior to surgery and will be given further preoperative instructions at that visit.  Routine postoperative instructions will be reviewed with the patient and her family in detail after surgery. Printed patient education handouts about the procedure was given to the patient to review at home.   2) Routine postoperative instructions were reviewed with the patient and her family in detail today including the expected length of recovery and likely postoperative course.  The patient concurred with the proposed plan, giving informed written consent for the surgery today.  Patient instructed on the importance of being NPO after midnight prior to her procedure.  If warranted preoperative prophylactic antibiotics and SCDs ordered on call to the OR to meet SCIP guidelines and adhere to recommendation laid forth in [place] Number 104 May 2009  "Antibiotic Prophylaxis for Gynecologic Procedures".

## 2017-05-07 NOTE — Progress Notes (Signed)
15 minute call to floor. 

## 2017-05-07 NOTE — Anesthesia Postprocedure Evaluation (Signed)
Anesthesia Post Note  Patient: Stefanie Gomez  Procedure(s) Performed: Procedure(s) (LRB): HYSTERECTOMY TOTAL LAPAROSCOPIC BILATERAL SALPINGECTOMY (Bilateral) CYSTOSCOPY (N/A)  Patient location during evaluation: PACU Anesthesia Type: General Level of consciousness: awake and alert Pain management: pain level controlled Vital Signs Assessment: post-procedure vital signs reviewed and stable Respiratory status: spontaneous breathing, nonlabored ventilation, respiratory function stable and patient connected to nasal cannula oxygen Cardiovascular status: blood pressure returned to baseline and stable Postop Assessment: no signs of nausea or vomiting Anesthetic complications: no     Last Vitals:  Vitals:   05/07/17 1350 05/07/17 1521  BP: (!) 102/52 (!) 94/52  Pulse: (!) 59 63  Resp: 16 16  Temp: 36.7 C 36.7 C    Last Pain:  Vitals:   05/07/17 1521  TempSrc: Oral  PainSc:                  [name]

## 2017-05-08 ENCOUNTER — Encounter: Payer: Self-pay | Admitting: Obstetrics and Gynecology

## 2017-05-08 DIAGNOSIS — C539 Malignant neoplasm of cervix uteri, unspecified: Secondary | ICD-10-CM | POA: Diagnosis not present

## 2017-05-08 LAB — CBC
HEMATOCRIT: 38.3 % (ref 35.0–47.0)
Hemoglobin: 12.6 g/dL (ref 12.0–16.0)
MCH: 28 pg (ref 26.0–34.0)
MCHC: 33 g/dL (ref 32.0–36.0)
MCV: 84.8 fL (ref 80.0–100.0)
PLATELETS: 172 10*3/uL (ref 150–440)
RBC: 4.52 MIL/uL (ref 3.80–5.20)
RDW: 13.4 % (ref 11.5–14.5)
WBC: 8.5 10*3/uL (ref 3.6–11.0)

## 2017-05-08 LAB — BASIC METABOLIC PANEL
ANION GAP: 4 — AB (ref 5–15)
BUN: 11 mg/dL (ref 6–20)
CALCIUM: 9.1 mg/dL (ref 8.9–10.3)
CO2: 26 mmol/L (ref 22–32)
Chloride: 111 mmol/L (ref 101–111)
Creatinine, Ser: 0.7 mg/dL (ref 0.44–1.00)
Glucose, Bld: 125 mg/dL — ABNORMAL HIGH (ref 65–99)
Potassium: 3.7 mmol/L (ref 3.5–5.1)
SODIUM: 141 mmol/L (ref 135–145)

## 2017-05-08 MED ORDER — IBUPROFEN 600 MG PO TABS: 600.0000 mg | ORAL_TABLET | Freq: Four times a day (QID) | ORAL | 3 refills | Status: DC | PRN

## 2017-05-08 MED ORDER — OXYCODONE-ACETAMINOPHEN 5-325 MG PO TABS: 1.0000 | ORAL_TABLET | ORAL | 0 refills | Status: DC | PRN

## 2017-05-08 NOTE — Progress Notes (Signed)
Patient is to be discharged home today. Patient is in no acute distress at this time, and assessment is unchanged from this morning. Patient's IV is out, discharge paperwork has been discussed with patient/family and there are no questions or concerns at this time. Patient will be accompanied downstairs by staff and family via wheelchair.   

## 2017-05-08 NOTE — Discharge Summary (Signed)
Physician Discharge Summary  Patient ID: Stefanie Gomez MRN: [id number] DOB/AGE: 1991/05/11 26 y.o.  Admit date: 05/07/2017 Discharge date: 05/08/2017  Admission Diagnoses: Scheduled hysterectomy  Discharge Diagnoses:  Active Problems:   S/P laparoscopic hysterectomy  Discharged Condition: good  Hospital Course: Patient underwent uncomplicated TLH, BS, cystoscopy for AIS (no residual noted on cone biopsy preoperatively).  Postoperative course was benign.  Patient had stable H&H, BUN and Cr on postoperative day 1 labs.  At the time of discharge patient had voided, was ambulating, tolerating a general diet, remained afebrile, and reported good pain control on po analgesics  Consults: None  Significant Diagnostic Studies: Results for orders placed or performed during the hospital encounter of 05/07/17 (from the past 72 hour(s))  Pregnancy, urine POC     Status: None   Collection Time: 05/07/17  9:12 AM  Result Value Ref Range   Preg Test, Ur NEGATIVE NEGATIVE    Comment:        THE SENSITIVITY OF THIS METHODOLOGY IS >24 mIU/mL   CBC     Status: None   Collection Time: 05/08/17  6:29 AM  Result Value Ref Range   WBC 8.5 3.6 - 11.0 K/uL   RBC 4.52 3.80 - 5.20 MIL/uL   Hemoglobin 12.6 12.0 - 16.0 g/dL   HCT 38.3 35.0 - 47.0 %   MCV 84.8 80.0 - 100.0 fL   MCH 28.0 26.0 - 34.0 pg   MCHC 33.0 32.0 - 36.0 g/dL   RDW 13.4 11.5 - 14.5 %   Platelets 172 150 - 440 K/uL  Basic metabolic panel     Status: Abnormal   Collection Time: 05/08/17  6:29 AM  Result Value Ref Range   Sodium 141 135 - 145 mmol/L   Potassium 3.7 3.5 - 5.1 mmol/L   Chloride 111 101 - 111 mmol/L   CO2 26 22 - 32 mmol/L   Glucose, Bld 125 (H) 65 - 99 mg/dL   BUN 11 6 - 20 mg/dL   Creatinine, Ser 0.70 0.44 - 1.00 mg/dL   Calcium 9.1 8.9 - 10.3 mg/dL   GFR calc non Af Amer >60 >60 mL/min   GFR calc Af Amer >60 >60 mL/min    Comment: (NOTE) The eGFR has been calculated using the CKD EPI equation. This calculation  has not been validated in all clinical situations. eGFR's persistently <60 mL/min signify possible Chronic Kidney Disease.    Anion gap 4 (L) 5 - 15     Treatments: surgery: TLH, BS, cystoscopy  Discharge Exam: Blood pressure (!) 100/58, pulse (!) 53, temperature 98 F (36.7 C), temperature source Oral, resp. rate 16, height '5\' 8"'$  (1.727 m), weight 152 lb (68.9 kg), last menstrual period 02/04/2017, SpO2 100 %, not currently breastfeeding. General appearance: alert, appears stated age and no distress Resp: no increased work of breathing GI: soft, non-tender; bowel sounds normal; no masses,  no organomegaly Extremities: extremities normal, atraumatic, no cyanosis or edema Incision/Wound: D/C/I dressing  Disposition: 01-Home or Self Care  Discharge Instructions    Call MD for:    Complete by:  As directed    Heavy vaginal bleeding greater than 1 pad an hour   Call MD for:  difficulty breathing, headache or visual disturbances    Complete by:  As directed    Call MD for:  extreme fatigue    Complete by:  As directed    Call MD for:  hives    Complete by:  As directed  Call MD for:  persistant dizziness or light-headedness    Complete by:  As directed    Call MD for:  persistant nausea and vomiting    Complete by:  As directed    Call MD for:  redness, tenderness, or signs of infection (pain, swelling, redness, odor or green/yellow discharge around incision site)    Complete by:  As directed    Call MD for:  severe uncontrolled pain    Complete by:  As directed    Call MD for:  temperature >100.4    Complete by:  As directed    Diet general    Complete by:  As directed    Discharge wound care:    Complete by:  As directed    You may apply a light dressing for minor discharge from the incision or to keep waistbands of clothing from rubbing.  You may also have been discharge with a clear dressing in which case this will be removed at your postoperative clinic visit.  You may  shower, use soap on your incision.  Avoiding baths or soaking the incision in the first 6 weeks following your surgery.   Driving restriction    Complete by:  As directed    Avoid driving for at least 2 weeks or while taking prescription narcotics.   Lifting restrictions    Complete by:  As directed    Weight restriction of 10lbs for 6 weeks.   Sexual acrtivity    Complete by:  As directed    No intercourse, tampons, or anything vaginally for 6 weeks     Allergies as of 05/08/2017      Reactions   Bactericin [bacitracin] Rash      Medication List    STOP taking these medications   medroxyPROGESTERone 10 MG tablet Commonly known as:  PROVERA     TAKE these medications   ibuprofen 600 MG tablet Commonly known as:  ADVIL,MOTRIN Take 1 tablet (600 mg total) by mouth every 6 (six) hours as needed.   oxyCODONE-acetaminophen 5-325 MG tablet Commonly known as:  PERCOCET/ROXICET Take 1-2 tablets by mouth every 4 (four) hours as needed (moderate to severe pain (when tolerating fluids)).      Follow-up Information    Malachy Mood, MD Follow up in 2 week(s).   Specialty:  Obstetrics and Gynecology Contact information: [address] [place] [address] [phone]           Signed: Malachy Mood 05/08/2017, 8:28 AM

## 2017-05-09 LAB — SURGICAL PATHOLOGY

## 2017-05-11 ENCOUNTER — Encounter: Payer: Self-pay | Admitting: Obstetrics and Gynecology

## 2017-05-16 ENCOUNTER — Other Ambulatory Visit: Payer: Medicaid Other

## 2017-05-16 ENCOUNTER — Encounter: Payer: Self-pay | Admitting: Obstetrics and Gynecology

## 2017-05-22 ENCOUNTER — Encounter: Payer: Self-pay | Admitting: Obstetrics and Gynecology

## 2017-05-22 ENCOUNTER — Ambulatory Visit (INDEPENDENT_AMBULATORY_CARE_PROVIDER_SITE_OTHER): Payer: Medicaid Other | Admitting: Obstetrics and Gynecology

## 2017-05-22 VITALS — BP 118/78 | HR 106 | Wt 149.0 lb

## 2017-05-22 DIAGNOSIS — Z4889 Encounter for other specified surgical aftercare: Secondary | ICD-10-CM

## 2017-05-23 NOTE — Progress Notes (Signed)
      Postoperative Follow-up Patient presents post op from laparoscopic TLH, BS, and cystoscopy 2weeks ago for abnormal uterine bleeding and AIS.  Subjective: Patient reports marked improvement in her preop symptoms. Eating a regular diet without difficulty. The patient is not having any pain.  Activity: normal activities of daily living.  Objective: Vitals:   05/22/17 1351  BP: 118/78  Pulse: (!) 106   Gen: NAD Abdomen: trocar sites, D/C/I healing well.  Non-tender, non-distended Extremities: no edema, no erythema  Assessment: 26 y.o. s/p lap BTL, BS, and cysto stable  Plan: Patient has done well after surgery with no apparent complications.  I have discussed the post-operative course to date, and the expected progress moving forward.  The patient understands what complications to be concerned about.  I will see the patient in routine follow up, or sooner if needed.    Activity plan: No heavy lifting.  Pathology reviewed no AIS or residual CIN.    Malachy Mood 05/23/2017, 12:53 PM

## 2017-05-24 NOTE — Op Note (Signed)
Preoperative Diagnosis: 1) 26 y.o. 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  Patient is status post BTL does not desire future fertility  Surgeon: Malachy Mood, MD  Anesthesia: General  Preoperative Antibiotics: none  Estimated Blood Loss: 160mL  Drains or Tubes: none  Implants: none  Specimens Removed: Uterus, cervix, and bilateral fallopian tubes  Complications: none  Intraoperative Findings: Normal uterus, cervix with postoperative changes, normal tubes and ovaries  Patient Condition: stable  Procedure in Detail:  Patient was taken to the operating room where she was administered general anesthesia.  She was positioned in the dorsal lithotomy position utilizing Allen stirups, prepped and draped in the usual sterile fashion.  Prior to proceeding with procedure a time out was performed.  Attention was turned to the patient's pelvis.  An indwelling foley catheter was placed to decompress the patient's bladder.  An operative speculum was placed to allow visualization of the cervix.  The anterior lip of the cervix was grasped with a single tooth tenaculum, and a large V-care uterine manipulator was placed to allow manipulation of the uterus.  The operative speculum and single tooth tenaculum were then removed.  Attention was turned to the patient's abdomen.  The umbilicus was infiltrated with 1% Sensorcaine, before making a stab incision using an 11 blade scalpel.  A 4mm Excel trocar was then used to gain direct entry into the peritoneal cavity utilizing the camera to visualize progress of the trocar during placement.  Once peritoneal entry had been achieved, insufflation was started and pneumoperitoneum established at a  pressure of 44mmHg.    One left and one right lower quadrant site were then injected with 1% Sensorcaine and a stab incision was made using an 11 blade scalpel.  Two additional 67mm Excel trocars were placed through these incisions under direct visualization. General inspection of the abdomen revealed the above noted findings.   The right tube was identified and grasped at its fimbriated end.  The tube was transected from its attachments to the ovary and mesosalpinx using a 31mm Harmonic scalpel.  The utero ovarian ligament was identified ligated and transected using the Harmonic scalpel. The round ligament was then likewise ligated and transected.  The anterior leaf of the broad ligament was dissected down to the level of the internal cervical os and a bladder flap was started.  The posterior leaf of the broad ligament was dissected down to the utero-sacral ligament.  The uterine artery was skeletonized before being ligated and transected using the Harmonic scalpel with cephelad pressure applied to the V-care device to assure lateralization of the ureter.  A bite was then taken with Harmonic medial to transected portio of uterine artery to further lateralize the ureter and vessel off the V-care cup.  The patient left adnexal structures were then dissected in similar fashion.  The bladder flap was completed and the bladder mobilized off the V-care cup.  An anterior colpotomy was scores and carried around in a clockwise fashion to free the specimen, which was then removed vaginally.  Inspection revealed all pedicles to be hemostatic before proceed with vaginal closure.  Attention was turned to the patient pelvis.  An operative speculum was placed and the anterior and posterior portion of the vaginal cuff were tagged with long Alice clamps.  The cuff  was closed using 0 Vicryl in an interrupted fashion.  The cuff was hemsotatic at the conclusion of closure without visible or palpable defects.  The indwelling foley  catheter was removed.  Cystoscopy was performed noting and intact bladder dome as well as brisk efflux of urine from bother ureteral orifices.  The cystoscopy was removed and the indwelling foley catheter was replaced.  Pneumoperitoneum was re-established, the pelvis was irrigated and all pedicles were once again inspected and noted to be hemostatic.  The cuff appeared well closed and free of bowl or other tissue that may have inadvertently been incorporated into the vaginal closure.  Additional hemostatic agents were not applied to all pedicles.      Pneumoperitoneum was evacuated and trocars were removed.  All trocar sites were then dressed with surgical skin glue.  Sponge needle and instrument counts were correct time two.  The patient tolerated the procedure well and was taken to the recovery room in stable condition.

## 2017-06-24 ENCOUNTER — Ambulatory Visit (INDEPENDENT_AMBULATORY_CARE_PROVIDER_SITE_OTHER): Payer: Medicaid Other | Admitting: Obstetrics and Gynecology

## 2017-06-24 ENCOUNTER — Encounter: Payer: Self-pay | Admitting: Obstetrics and Gynecology

## 2017-06-24 VITALS — BP 132/66 | HR 96 | Wt 158.0 lb

## 2017-06-24 DIAGNOSIS — Z09 Encounter for follow-up examination after completed treatment for conditions other than malignant neoplasm: Secondary | ICD-10-CM

## 2017-06-24 NOTE — Progress Notes (Signed)
      Postoperative Follow-up Patient presents post op from [facility], BS, cystoscopy 6weeks ago for AIS.  Subjective: Patient reports marked improvement in her preop symptoms. Eating a regular diet without difficulty. The patient is not having any pain.  Activity: normal activities of daily living.  Objective: Vitals:   06/24/17 1525  BP: 132/66  Pulse: 96   Gen: NAD Pulmonary: no increased work of breathing Abdomen: soft, non-tender, non-distended GU: normal external female genitalia, cuff well healed Ext: no edema  Assessment: 26 y.o. s/p TLH, BS, cysto stable  Plan: Patient has done well after surgery with no apparent complications.  I have discussed the post-operative course to date, and the expected progress moving forward.  The patient understands what complications to be concerned about.  I will see the patient in routine follow up, or sooner if needed.    Activity plan: No restriction.  Malachy Mood 06/24/2017, 11:26 PM

## 2017-07-09 NOTE — Telephone Encounter (Signed)
Pt has since been seen by [facility] and had a hysterectomy.

## 2017-12-09 ENCOUNTER — Ambulatory Visit (INDEPENDENT_AMBULATORY_CARE_PROVIDER_SITE_OTHER): Payer: Self-pay | Admitting: Nurse Practitioner

## 2017-12-09 VITALS — BP 110/80 | HR 113 | Temp 101.8°F | Wt 160.0 lb

## 2017-12-09 DIAGNOSIS — R509 Fever, unspecified: Secondary | ICD-10-CM

## 2017-12-09 DIAGNOSIS — R6889 Other general symptoms and signs: Secondary | ICD-10-CM

## 2017-12-09 LAB — POCT INFLUENZA A/B
Influenza A, POC: NEGATIVE
Influenza B, POC: NEGATIVE

## 2017-12-09 MED ORDER — OSELTAMIVIR PHOSPHATE 75 MG PO CAPS: 75.0000 mg | ORAL_CAPSULE | Freq: Two times a day (BID) | ORAL | 0 refills | Status: AC

## 2017-12-09 MED ORDER — ONDANSETRON HCL 4 MG PO TABS: 4.0000 mg | ORAL_TABLET | Freq: Three times a day (TID) | ORAL | 0 refills | Status: AC | PRN

## 2017-12-09 NOTE — Progress Notes (Signed)
Subjective:  Stefanie Gomez is a 27 y.o. female who presents for evaluation of influenza like symptoms.  Symptoms include achiness, cough described as nonproductive, chills, fever: suspected fevers but not measured at home, headache described as dull, nausea without vomiting and sore throat.  Onset of symptoms was 1 day ago, and has been gradually worsening since that time.  Treatment to date:  Theraflu.  High risk factors for influenza complications:  none.  Patient states a co-worker was dxed with influenza last week.  The following portions of the patient's history were reviewed and updated as appropriate:  allergies, current medications and past medical history.  Constitutional: positive for anorexia, chills, fevers, malaise and sweats, negative for weight loss Eyes: negative Ears, nose, mouth, throat, and face: positive for nasal congestion, sore throat and bilateral ear pressure/fullness, negative for ear drainage, earaches and hoarseness Respiratory: positive for cough, negative for asthma, dyspnea on exertion, sputum, stridor and wheezing Cardiovascular: negative Gastrointestinal: positive for abdominal pain, nausea and decreased appetite, negative for change in bowel habits, constipation, diarrhea and vomiting Neurological: positive for headaches, negative for dizziness, paresthesia, tremors, vertigo and weakness Objective:  BP 110/80   Pulse (!) 113   Temp (!) 101.8 F (38.8 C)   Wt 160 lb (72.6 kg)   LMP 02/06/2017 (Approximate)   SpO2 98%   BMI 24.33 kg/m  General appearance: alert, cooperative, fatigued, flushed and no distress Head: Normocephalic, without obvious abnormality, atraumatic Eyes: conjunctivae/corneas clear. PERRL, EOM's intact. Fundi benign. Ears: normal TM's and external ear canals both ears Nose: clear discharge, turbinates swollen, inflamed, no sinus tenderness Throat: abnormal findings: mild oropharyngeal erythema and no exudates, uvula midline Lungs: clear  to auscultation bilaterally Heart: regular rate and rhythm, S1, S2 normal, no murmur, click, rub or gallop Abdomen: abnormal findings:  tenderness LLQ Pulses: 2+ and symmetric Skin: Skin color, texture, turgor normal. No rashes or lesions Lymph nodes: cervical lymphadenopathy Neurologic: Grossly normal    Assessment:  influenza like syndrome    Plan:  Discussed the importance of avoiding unnecessary antibiotic therapy. Educational material distributed and questions answered. Suggested symptomatic OTC remedies. Supportive care with appropriate antipyretics and fluids. Tamiflu and Zofran per orders. Follow up as needed.

## 2017-12-09 NOTE — Patient Instructions (Addendum)

## 2017-12-25 ENCOUNTER — Ambulatory Visit (INDEPENDENT_AMBULATORY_CARE_PROVIDER_SITE_OTHER): Payer: No Typology Code available for payment source | Admitting: Internal Medicine

## 2017-12-25 ENCOUNTER — Encounter: Payer: Self-pay | Admitting: Obstetrics and Gynecology

## 2017-12-25 ENCOUNTER — Encounter: Payer: Self-pay | Admitting: Internal Medicine

## 2017-12-25 VITALS — BP 98/60 | HR 98 | Temp 98.6°F | Ht 68.0 in | Wt 153.4 lb

## 2017-12-25 DIAGNOSIS — Z0000 Encounter for general adult medical examination without abnormal findings: Secondary | ICD-10-CM

## 2017-12-25 DIAGNOSIS — Z90710 Acquired absence of both cervix and uterus: Secondary | ICD-10-CM

## 2017-12-25 DIAGNOSIS — R87613 High grade squamous intraepithelial lesion on cytologic smear of cervix (HGSIL): Secondary | ICD-10-CM | POA: Diagnosis not present

## 2017-12-25 DIAGNOSIS — Z1389 Encounter for screening for other disorder: Secondary | ICD-10-CM

## 2017-12-25 DIAGNOSIS — Z1329 Encounter for screening for other suspected endocrine disorder: Secondary | ICD-10-CM | POA: Diagnosis not present

## 2017-12-25 DIAGNOSIS — Z13220 Encounter for screening for lipoid disorders: Secondary | ICD-10-CM

## 2017-12-25 LAB — URINALYSIS, ROUTINE W REFLEX MICROSCOPIC
BILIRUBIN URINE: NEGATIVE
Hgb urine dipstick: NEGATIVE
Leukocytes, UA: NEGATIVE
NITRITE: NEGATIVE
PH: 5.5 (ref 5.0–8.0)
Specific Gravity, Urine: >=1.03 — AB (ref 1.000–1.030)
Total Protein, Urine: NEGATIVE
Urine Glucose: NEGATIVE
Urobilinogen, UA: 0.2 (ref 0.0–1.0)

## 2017-12-25 LAB — CBC WITH DIFFERENTIAL/PLATELET
BASOS ABS: 0 10*3/uL (ref 0.0–0.1)
BASOS PCT: 0.5 % (ref 0.0–3.0)
EOS ABS: 0.1 10*3/uL (ref 0.0–0.7)
Eosinophils Relative: 1.1 % (ref 0.0–5.0)
HEMATOCRIT: 41.3 % (ref 36.0–46.0)
Hemoglobin: 13.8 g/dL (ref 12.0–15.0)
LYMPHS ABS: 2 10*3/uL (ref 0.7–4.0)
Lymphocytes Relative: 27.6 % (ref 12.0–46.0)
MCHC: 33.3 g/dL (ref 30.0–36.0)
MCV: 85.6 fl (ref 78.0–100.0)
Monocytes Absolute: 0.5 10*3/uL (ref 0.1–1.0)
Monocytes Relative: 7.5 % (ref 3.0–12.0)
NEUTROS PCT: 63.3 % (ref 43.0–77.0)
Neutro Abs: 4.6 10*3/uL (ref 1.4–7.7)
PLATELETS: 216 10*3/uL (ref 150.0–400.0)
RBC: 4.82 Mil/uL (ref 3.87–5.11)
RDW: 13.8 % (ref 11.5–15.5)
WBC: 7.3 10*3/uL (ref 4.0–10.5)

## 2017-12-25 LAB — COMPREHENSIVE METABOLIC PANEL
ALT: 8 U/L (ref 0–35)
AST: 12 U/L (ref 0–37)
Albumin: 4.3 g/dL (ref 3.5–5.2)
Alkaline Phosphatase: 48 U/L (ref 39–117)
BILIRUBIN TOTAL: 2.2 mg/dL — AB (ref 0.2–1.2)
BUN: 12 mg/dL (ref 6–23)
CHLORIDE: 105 meq/L (ref 96–112)
CO2: 30 meq/L (ref 19–32)
CREATININE: 0.74 mg/dL (ref 0.40–1.20)
Calcium: 9.8 mg/dL (ref 8.4–10.5)
GFR: 100.18 mL/min (ref >60.00)
Glucose, Bld: 77 mg/dL (ref 70–99)
Potassium: 3.9 mEq/L (ref 3.5–5.1)
SODIUM: 140 meq/L (ref 135–145)
Total Protein: 7 g/dL (ref 6.0–8.3)

## 2017-12-25 LAB — TSH: TSH: 1.69 u[IU]/mL (ref 0.35–4.50)

## 2017-12-25 LAB — T4, FREE: FREE T4: 0.85 ng/dL (ref 0.60–1.60)

## 2017-12-25 LAB — LIPID PANEL
CHOL/HDL RATIO: 3
Cholesterol: 140 mg/dL (ref 0–200)
HDL: 48.2 mg/dL (ref >39.00)
LDL Cholesterol: 81 mg/dL (ref 0–99)
NonHDL: 92.16
TRIGLYCERIDES: 55 mg/dL (ref 0.0–149.0)
VLDL: 11 mg/dL (ref 0.0–40.0)

## 2017-12-25 NOTE — Patient Instructions (Signed)
Please f/u in 6 month sooner if needed  Call back when ready for referral to Dr. Georgianne Fick or my chart message  Figure out date of Tdap and if you had Hep B titer  Take care  Consider Dr. Kellie Moor dermatology In the future    Cervical Cancer The cervix is the opening and bottom part of the uterus between the vagina and the uterus. Cervical cancer is a fairly common cancer. It occurs most often in women between the ages of 66 years and 26 years. Cells of the cervix act very much like skin cells. These cells are exposed to toxins, viruses, and bacteria that may cause abnormal changes. There are two kinds of cancers of the cervix:  Squamous cell carcinoma. This type of cancer starts in the flat or scale-like cells that line the cervix. Squamous cell carcinoma can develop from a sexually transmitted infection caused by the human papillomavirus (HPV).  Adenocarcinoma. This type of cervical cancer starts in glandular cells that line the cervix.  What increases the risk? The risk of getting cancer of the cervix is related to your lifestyle, sexual history, health, and immune system. Risks for cervical cancer include:  Having a sexually transmitted viral infection. These include: ? Chlamydia. ? Herpes. ? HPV.  Becoming sexually active before age 53 years.  Having more than one sexual partner or having sex with someone who has more than one sexual partner.  Not using condoms with sexual partners.  Having had cancer of the vagina or vulva.  Having a sexual partner who has or had cancer of the penis or who has had a sexual partner with abnormal cervical cells (dysplasia) or cervical cancer.  Using oral contraceptives (also called birth control pills).  Smoking.  Having a weakened immune system. For example, human immunodeficiency virus (HIV) or other immune deficiency disorders.  Being the daughter of a woman who took diethylstilbestrol (DES) during pregnancy.  Having a sister or mother  who has had cancer of the cervix.  Being [place] American, Hispanic, Asian, or a woman from the [place].  A history of dysplasia of the cervix.  What are the signs or symptoms? Symptoms are usually not present in the early stages of cervical cancer. Once the cancer invades the cervix and surrounding tissues, the woman may have:  Abnormal vaginal bleeding or menstrual bleeding that is longer or heavier than usual.  Bleeding after intercourse, douching, or a Pap test.  Vaginal bleeding following menopause.  Abnormal vaginal discharge.  Pelvic discomfort or pain.  An abnormal Pap test.  Pain during sexual intercourse.  Symptoms of more advanced cervical cancer may include:  Loss of appetite or weight loss.  Tiredness (fatigue).  Back and leg pain.  Inability to control urination or bowel movements.  How is this diagnosed? A pelvic exam and Pap test are done to diagnose the condition. If abnormalities are found during the exam or Pap test, the Pap test may be repeated in 3 months, or your health care provider may do additional tests or procedures, such as:  A colposcopy. This is a procedure that uses a special microscope that allows the health care provider to magnify and closely examine the cells of the cervix, vagina, and vulva.  Cervical biopsies. This is a procedure where small tissue samples are taken from the cervix to be examined under a microscope by a specialist.  A cone biopsy. This is a procedure to test for or remove cancerous tissue.  Other tests may be  needed, including:  Cystoscopy.  Proctoscopy or sigmoidoscopy.  Ultrasound.  CT scan.  MRI.  Laparoscopy.  There are different stages of cervical cancer:  Stage 0, carcinoma in situ (CIS)-This first stage of cancer is the last and most serious stage of dysplasia.  Stage I-This means the tumor is in the uterus and cervix only.  Stage II-This means the tumor has spread to the upper vagina.  The cancer has spread beyond the uterus but not to the pelvic walls or lower third of the vagina.  Stage III-This means the tumor has invaded the side wall of the pelvis and the lower third of the vagina. If the tumor blocks the tubes that carry urine to the bladder (ureters), it may cause urine to back up and the kidneys to swell (hydronephrosis).  Stage IV-This means the tumor has spread to the rectum or bladder. In the later part of this stage, it has also spread to distant organs, like the lungs.  How is this treated? Treatment options can include:  Cone biopsy to remove the cancerous tissue.  Removal of the entire uterus and cervix.  Removal of the uterus, cervix, upper vagina, lymph nodes, and surrounding tissue (modified radical hysterectomy). The ovaries may be left in place or removed.  Medicines to treat cancer.  A combination of surgery, radiation, and chemotherapy.  Biological response modifiers. These are substances that help strengthen your immune system's fight against cancer or infection. They may be used in combination with chemotherapy.  Follow these instructions at home:  Get a gynecology exam and Pap test once every year or as directed by your health care provider.  Get the HPV vaccine.  Do not smoke.  Do not have sexual intercourse until your health care provider says it is okay.  Use a condom every time you have sex. Contact a health care provider if:  You have increased pelvic pain or pressure.  Your are becoming increasingly tired.  You have increased leg or back pain.  You have a fever.  You have abnormal bleeding or discharge.  You lose weight. Get help right away if:  You cannot urinate.  You have blood in your urine.  You have blood or pressure with a bowel movement.  You develop severe back, stomach, or pelvic pain. This information is not intended to replace advice given to you by your health care provider. Make sure you discuss any  questions you have with your health care provider. Document Released: 10/22/2005 Document Revised: 04/04/2016 Document Reviewed: 04/15/2013 Elsevier Interactive Patient Education  2017 [employer].

## 2017-12-25 NOTE — Progress Notes (Addendum)
Chief Complaint  Patient presents with  . New Patient (Initial Visit)   New patient former pt [facility]  1. 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 Georgianne Fick 05/2017 doing well no complaints       Review of Systems  Constitutional: Negative for weight loss.  HENT: Negative for hearing loss.   Eyes: Negative for blurred vision.  Respiratory: Negative for shortness of breath.   Cardiovascular: Negative for chest pain.  Gastrointestinal: Negative for abdominal pain.  Genitourinary:       No GU sx's  Musculoskeletal: Negative for falls.  Skin: Negative for rash.  Neurological: Negative for headaches.  Psychiatric/Behavioral: Negative for depression.   Past Medical History:  Diagnosis Date  . Adenocarcinoma in situ (AIS) of uterine cervix    s/p total hysteretectomy including uterus, cervix fallopian tubes 05/2017 Dr. Georgianne Fick ovaries intact   . Complication of anesthesia   . Family history of adverse reaction to anesthesia    SISTER-HARD TIME WAKING UP  . Medical history non-contributory   . PONV (postoperative nausea and vomiting) 09/2016   X 1 AFTER BTL   Past Surgical History:  Procedure Laterality Date  . ABDOMINAL HYSTERECTOMY     05/2017 Dr. Georgianne Fick   . APPENDECTOMY     2010  . CERVICAL CONE BIOPSY  03/05/2017   [facility]  . CERVICAL CONIZATION W/BX N/A 03/05/2017   Procedure: CONIZATION CERVIX WITH BIOPSY;  Surgeon: Malachy Mood, MD;  Location: [facility];  Service: Gynecology;  Laterality: N/A;  . CYSTOSCOPY N/A 05/07/2017   Procedure: CYSTOSCOPY;  Surgeon: Malachy Mood, MD;  Location: [facility];  Service: Gynecology;  Laterality: N/A;  . LAPAROSCOPIC HYSTERECTOMY Bilateral 05/07/2017   Procedure: HYSTERECTOMY TOTAL LAPAROSCOPIC BILATERAL SALPINGECTOMY;  Surgeon: Malachy Mood, MD;  Location: [facility];  Service: Gynecology;  Laterality: Bilateral;  . LAPAROSCOPIC TUBAL LIGATION Bilateral  09/25/2016   Procedure: LAPAROSCOPIC TUBAL LIGATION;  Surgeon: Malachy Mood, MD;  Location: [facility];  Service: Gynecology;  Laterality: Bilateral;  . TONSILLECTOMY    . TUBAL LIGATION  09/2016  . WISDOM TOOTH EXTRACTION     Family History  Problem Relation Age of Onset  . Colon cancer Father 35  . Lung cancer Father   . Rectal cancer Father   . Cancer Father        stage 4 colon dx age 53   . Hyperlipidemia Mother   . Hypertension Mother   . Cancer Paternal Grandfather        colon cancer    Social History   Socioeconomic History  . Marital status: Single    Spouse name: Not on file  . Number of children: Not on file  . Years of education: Not on file  . Highest education level: Not on file  Social Needs  . Financial resource strain: Not on file  . Food insecurity - worry: Not on file  . Food insecurity - inability: Not on file  . Transportation needs - medical: Not on file  . Transportation needs - non-medical: Not on file  Occupational History  . Not on file  Tobacco Use  . Smoking status: Never Smoker  . Smokeless tobacco: Never Used  Substance and Sexual Activity  . Alcohol use: No  . Drug use: No  . Sexual activity: Yes    Birth control/protection: Surgical    Comment: BTL  Other Topics Concern  . Not on file  Social History Narrative  Married 2 kids    No outpatient medications have been marked as taking for the 12/25/17 encounter (Office Visit) with McLean-Scocuzza, Nino Glow, MD.   Allergies  Allergen Reactions  . Sulfamethoxazole-Trimethoprim Rash   Recent Results (from the past 2160 hour(s))  POCT Influenza A/B     Status: Normal   Collection Time: 12/09/17  4:20 PM  Result Value Ref Range   Influenza A, POC Negative Negative   Influenza B, POC Negative Negative   Objective  Body mass index is 23.32 kg/m. Wt Readings from Last 3 Encounters:  12/25/17 153 lb 6.4 oz (69.6 kg)  12/09/17 160 lb (72.6 kg)  06/24/17 158 lb (71.7 kg)   Temp  Readings from Last 3 Encounters:  12/25/17 98.6 F (37 C) (Oral)  12/09/17 (!) 101.8 F (38.8 C)  05/08/17 98 F (36.7 C) (Oral)   BP Readings from Last 3 Encounters:  12/25/17 98/60  12/09/17 110/80  06/24/17 132/66   Pulse Readings from Last 3 Encounters:  12/25/17 98  12/09/17 (!) 113  06/24/17 96   O2 sat room air 96% Physical Exam  Constitutional: She is oriented to person, place, and time and well-developed, well-nourished, and in no distress. Vital signs are normal.  HENT:  Head: Normocephalic and atraumatic.  Mouth/Throat: Oropharynx is clear and moist and mucous membranes are normal.  Eyes: Conjunctivae are normal. Pupils are equal, round, and reactive to light.  Cardiovascular: Normal rate, regular rhythm and normal heart sounds.  Pulmonary/Chest: Effort normal and breath sounds normal.  Abdominal: Soft. Bowel sounds are normal. There is no tenderness.  Neurological: She is alert and oriented to person, place, and time. Gait normal. Gait normal.  Skin: Skin is warm and dry.  Benign nevi to trunk  Psychiatric: Mood, memory, affect and judgment normal.  Nursing note and vitals reviewed.   Assessment   1. 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 HM Plan  1. F/u OB/GYN Dr. Georgianne Fick due to f/u 05/2018 needs referral will call back when closer to referral time  2. Had flu shot 09/2017  Tdap and hep b titer check with employee health   Declines STD check  Check CMET, CBC, lipid, UA, TSH, T4 today    Consider dermatology in future h/o tanning bed use advised not to do this in future   Provider: Dr. Olivia Mackie McLean-Scocuzza-Internal Medicine

## 2017-12-25 NOTE — Progress Notes (Signed)
Pre visit review using our clinic review tool, if applicable. No additional management support is needed unless otherwise documented below in the visit note. 

## 2017-12-26 ENCOUNTER — Encounter: Payer: Self-pay | Admitting: Internal Medicine

## 2017-12-30 ENCOUNTER — Other Ambulatory Visit: Payer: Self-pay | Admitting: Internal Medicine

## 2017-12-30 NOTE — Progress Notes (Signed)
Tdap 04/29/17    I got Hep B booster on 06/05/17 and then 06/07/17 it says Hep B Surface Antibody 210.9

## 2018-01-01 ENCOUNTER — Other Ambulatory Visit: Payer: Self-pay | Admitting: Internal Medicine

## 2018-01-01 NOTE — Progress Notes (Signed)
Flu shot had 07/16/17 Tdap 04/29/17  quantiferon gold neg 04/29/17  MMR immune 04/29/17 Varicella immune 04/29/17 Hep B immne ab 20.9 06/07/17   [place]

## 2018-01-20 ENCOUNTER — Ambulatory Visit: Payer: Self-pay | Admitting: *Deleted

## 2018-01-20 NOTE — Telephone Encounter (Signed)
Pt states she has been experiencing bloody stools, which have been mixed in with stool that is solid and soft in consistency. Pt states this has been occurring once a day for the past 5 days. Pt states she has also experienced some dizziness for the past 3 days at times. Pt denies any pain and states she does not have a history of hemorrhoids. Pt scheduled for appt on 3/19 at 9 am at the [facility] Location due to PCP not being available for appt on tomorrow. Pt advised to seek treatment in the ED if symptoms worsen. Pt verbalized understanding.  Reason for Disposition . MODERATE rectal bleeding (small blood clots, passing blood without stool, or toilet water turns red)  Answer Assessment - Initial Assessment Questions 1. APPEARANCE of BLOOD: "What color is it?" "Is it passed separately, on the surface of the stool, or mixed in with the stool?"      Solid, and soft and mixed in with blood 2. AMOUNT: "How much blood was passed?"      "looks like a period" 3. FREQUENCY: "How many times has blood been passed with the stools?"      once a day 4. ONSET: "When was the blood first seen in the stools?" (Days or weeks)      Approximately 5 days ago 5. DIARRHEA: "Is there also some diarrhea?" If so, ask: "How many diarrhea stools were passed in past 24 hours?"      Like diarrhea, yesterday only once 6. CONSTIPATION: "Do you have constipation?" If so, "How bad is it?"     No 7. RECURRENT SYMPTOMS: "Have you had blood in your stools before?" If so, ask: "When was the last time?" and "What happened that time?"      Has had bleeding before with constipaiton 8. BLOOD THINNERS: "Do you take any blood thinners?" (e.g., Coumadin/warfarin, Pradaxa/dabigatran, aspirin)     No 9. OTHER SYMPTOMS: "Do you have any other symptoms?"  (e.g., abdominal pain, vomiting, dizziness, fever)     Dizziness x 3 days 10. PREGNANCY: "Is there any chance you are pregnant?" "When was your last menstrual period?"       No, had  hysterectomy  Protocols used: RECTAL BLEEDING-A-AH

## 2018-01-21 ENCOUNTER — Encounter: Payer: Self-pay | Admitting: Primary Care

## 2018-01-21 ENCOUNTER — Ambulatory Visit (INDEPENDENT_AMBULATORY_CARE_PROVIDER_SITE_OTHER): Payer: No Typology Code available for payment source | Admitting: Primary Care

## 2018-01-21 VITALS — BP 104/78 | HR 66 | Temp 98.4°F | Wt 151.8 lb

## 2018-01-21 DIAGNOSIS — K625 Hemorrhage of anus and rectum: Secondary | ICD-10-CM

## 2018-01-21 LAB — CBC WITH DIFFERENTIAL/PLATELET
BASOS PCT: 0.7 % (ref 0.0–3.0)
Basophils Absolute: 0 10*3/uL (ref 0.0–0.1)
Eosinophils Absolute: 0 10*3/uL (ref 0.0–0.7)
Eosinophils Relative: 0.7 % (ref 0.0–5.0)
HEMATOCRIT: 41 % (ref 36.0–46.0)
HEMOGLOBIN: 14 g/dL (ref 12.0–15.0)
LYMPHS PCT: 40.7 % (ref 12.0–46.0)
Lymphs Abs: 2.2 10*3/uL (ref 0.7–4.0)
MCHC: 34.2 g/dL (ref 30.0–36.0)
MCV: 85.9 fl (ref 78.0–100.0)
MONO ABS: 0.4 10*3/uL (ref 0.1–1.0)
MONOS PCT: 7.5 % (ref 3.0–12.0)
Neutro Abs: 2.7 10*3/uL (ref 1.4–7.7)
Neutrophils Relative %: 50.4 % (ref 43.0–77.0)
Platelets: 180 10*3/uL (ref 150.0–400.0)
RBC: 4.77 Mil/uL (ref 3.87–5.11)
RDW: 14.1 % (ref 11.5–15.5)
WBC: 5.3 10*3/uL (ref 4.0–10.5)

## 2018-01-21 LAB — HEMOCCULT GUIAC POC 1CARD (OFFICE)

## 2018-01-21 MED ORDER — HYDROCORTISONE ACETATE 25 MG RE SUPP: 25.0000 mg | Freq: Two times a day (BID) | RECTAL | 0 refills | Status: DC

## 2018-01-21 NOTE — Patient Instructions (Signed)
Stop by the lab prior to leaving today. I will notify you of your results once received.   Start the rectal suppositories. Insert one suppository into the rectum twice daily for 6 days.   Please schedule an appointment with your PCP if no improvement in 2-3 days.  It was a pleasure meeting you!

## 2018-01-21 NOTE — Progress Notes (Signed)
Subjective:    Patient ID: Stefanie Gomez, female    DOB: 1990-12-08, 27 y.o.   MRN: [id number]  HPI  Ms. Strider is a 27 year old female who presents today with a chief complaint of rectal bleeding. She also reports dizziness.  She's noticing rectal bleeding with her bowel movements which are soft bowel movements. Bowel movements and bleeding occur once daily. Her symptoms of rectal bleeding began 5 days ago, dizziness began 4 days ago.   Her blood is bright red which is seen in the toilet bowl and toilet paper. She has had mild bleeding in the past with constipation. She denies fevers, chills, abdominal pain, constipation, firm stools, nausea, changes in diet, new medications.   Review of Systems  Constitutional: Negative for fever and unexpected weight change.  Gastrointestinal: Positive for blood in stool. Negative for abdominal pain, constipation, diarrhea, nausea, rectal pain and vomiting.  Neurological: Positive for dizziness. Negative for numbness and headaches.       Past Medical History:  Diagnosis Date  . Adenocarcinoma in situ (AIS) of uterine cervix    s/p total hysteretectomy including uterus, cervix fallopian tubes 05/2017 Dr. Georgianne Fick ovaries intact   . Cancer ([place])   . Complication of anesthesia   . Family history of adverse reaction to anesthesia    SISTER-HARD TIME WAKING UP  . Medical history non-contributory   . PONV (postoperative nausea and vomiting) 09/2016   X 1 AFTER BTL     Social History   Socioeconomic History  . Marital status: Single    Spouse name: Not on file  . Number of children: Not on file  . Years of education: Not on file  . Highest education level: Not on file  Social Needs  . Financial resource strain: Not on file  . Food insecurity - worry: Not on file  . Food insecurity - inability: Not on file  . Transportation needs - medical: Not on file  . Transportation needs - non-medical: Not on file  Occupational History  . Not on file   Tobacco Use  . Smoking status: Never Smoker  . Smokeless tobacco: Never Used  Substance and Sexual Activity  . Alcohol use: No  . Drug use: No  . Sexual activity: Yes    Birth control/protection: Surgical    Comment: BTL  Other Topics Concern  . Not on file  Social History Narrative   Married 2 kids girls    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[facility] , 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    No drinking smoking    No guns, wears seatbelts    Safe in relationship     Past Surgical History:  Procedure Laterality Date  . ABDOMINAL HYSTERECTOMY     05/2017 Dr. Georgianne Fick   . APPENDECTOMY     2010  . CERVICAL CONE BIOPSY  03/05/2017   [facility]  . CERVICAL CONIZATION W/BX N/A 03/05/2017   Procedure: CONIZATION CERVIX WITH BIOPSY;  Surgeon: Malachy Mood, MD;  Location: [facility];  Service: Gynecology;  Laterality: N/A;  . CYSTOSCOPY N/A 05/07/2017   Procedure: CYSTOSCOPY;  Surgeon: Malachy Mood, MD;  Location: [facility];  Service: Gynecology;  Laterality: N/A;  . LAPAROSCOPIC HYSTERECTOMY Bilateral 05/07/2017   Procedure: HYSTERECTOMY TOTAL LAPAROSCOPIC BILATERAL SALPINGECTOMY;  Surgeon: Malachy Mood, MD;  Location: [facility];  Service: Gynecology;  Laterality: Bilateral;  . LAPAROSCOPIC TUBAL LIGATION Bilateral 09/25/2016   Procedure: LAPAROSCOPIC TUBAL LIGATION;  Surgeon: Malachy Mood, MD;  Location: [facility];  Service: Gynecology;  Laterality: Bilateral;  .  TONSILLECTOMY    . TUBAL LIGATION  09/2016  . WISDOM TOOTH EXTRACTION      Family History  Problem Relation Age of Onset  . Colon cancer Father 15  . Lung cancer Father   . Rectal cancer Father   . Cancer Father        stage 4 colon dx age 61   . Hyperlipidemia Mother   . Hypertension Mother   . Cancer Paternal Grandfather        colon cancer     Allergies  Allergen Reactions  . Sulfamethoxazole-Trimethoprim Rash    No current outpatient medications on file prior to visit.   No current facility-administered medications on file  prior to visit.     BP 104/78   Pulse 66   Temp 98.4 F (36.9 C) (Oral)   Wt 151 lb 12 oz (68.8 kg)   LMP 02/06/2017 (Approximate)   SpO2 98%   BMI 23.07 kg/m    Objective:   Physical Exam  Constitutional: She appears well-nourished.  Neck: Neck supple.  Cardiovascular: Normal rate and regular rhythm.  Pulmonary/Chest: Effort normal and breath sounds normal.  Genitourinary: Rectum normal. Rectal exam shows no external hemorrhoid, no internal hemorrhoid, no fissure, no mass, no tenderness, anal tone normal and guaiac negative stool.  Skin: Skin is warm and dry.          Assessment & Plan:  Rectal Bleeding:  Present for 5 days, dizziness x 4 days.  Exam today without evidence of external or internal hemorrhoid.  Hemoccult stool card negative in office.  She has no other GI symptoms. She appears well, normal vitals.  Check CBC with diff to rule out anemia and infection. Rx or Anusol-HC suppositories sent to pharmacy for potential internal hemorrhoid that cannot be felt on exam.  She will update in 2-3 days if symptoms persist, she develops any other GI symptoms.   Pleas Koch, NP

## 2018-05-28 ENCOUNTER — Encounter: Payer: Self-pay | Admitting: Nurse Practitioner

## 2018-05-28 ENCOUNTER — Ambulatory Visit (INDEPENDENT_AMBULATORY_CARE_PROVIDER_SITE_OTHER): Payer: Self-pay | Admitting: Nurse Practitioner

## 2018-05-28 VITALS — BP 120/80 | HR 67 | Temp 98.2°F | Wt 162.0 lb

## 2018-05-28 DIAGNOSIS — H1013 Acute atopic conjunctivitis, bilateral: Secondary | ICD-10-CM

## 2018-05-28 MED ORDER — AZELASTINE HCL 0.05 % OP SOLN: 2.0000 [drp] | Freq: Two times a day (BID) | OPHTHALMIC | 0 refills | Status: AC

## 2018-05-28 NOTE — Progress Notes (Signed)
   Subjective:    Patient ID: Stefanie Gomez, female    DOB: [birth year], 27 y.o.   MRN: [id number]  The patient is a 27 y.o. Female who presents with bilateral eye itching, redness and pain for the past 4 days.  The patient does endorse photophobia.  The patient states her symptoms began in the left eye and now in both.  The patient denies mucopurulent drainage, tearing, fever, chills, cough, congestion, runny nose, blurred vision or change in vision.  The patient also does not wear contacts, denies a history of allergies and does not smoke. The patient denies foreign body presence.   Conjunctivitis   The current episode started 3 to 5 days ago. The onset was sudden. The problem occurs continuously. The problem has been gradually worsening. The problem is mild. Nothing (patient used Visine without relief) relieves the symptoms. The symptoms are aggravated by light and movement. Associated symptoms include eye itching, photophobia, eye pain and eye redness. Pertinent negatives include no decreased vision, no double vision, no congestion, no ear discharge, no ear pain, no rhinorrhea, no sore throat and no eye discharge.   Reviewed the patient's medical history, current medications and allergies.  Review of Systems  HENT: Negative for congestion, ear discharge, ear pain, rhinorrhea and sore throat.   Eyes: Positive for photophobia, pain, redness and itching. Negative for double vision and discharge.  Respiratory: Negative.   Cardiovascular: Negative.   Skin: Negative.   Allergic/Immunologic: Negative for environmental allergies.       Objective:   Physical Exam  Constitutional: She appears well-developed and well-nourished. No distress.  HENT:  Head: Normocephalic and atraumatic.  Right Ear: External ear normal.  Left Ear: External ear normal.  Mouth/Throat: Oropharynx is clear and moist.  Eyes: Pupils are equal, round, and reactive to light. EOM and lids are normal. Lids are everted and  swept, no foreign bodies found. Right eye exhibits no discharge. Left eye exhibits no discharge. Right conjunctiva is injected. Left conjunctiva is injected. No scleral icterus.  Bilateral conjunctiva inflamed, no drainage  Neck: Normal range of motion. Neck supple.  Cardiovascular: Normal rate, regular rhythm and normal heart sounds.  Pulmonary/Chest: Effort normal and breath sounds normal.  Skin: Skin is warm and dry.  Psychiatric: She has a normal mood and affect.  Vitals reviewed.     Assessment & Plan:  Exam findings, diagnosis etiology and medication use and indications reviewed with patient. Follow- Up and discharge instructions provided. No emergent/urgent issues found on exam.  Patient verbalized understanding of information provided and agrees with plan of care (POC), all questions answered.  1. Allergic conjunctivitis of both eyes - azelastine (OPTIVAR) 0.05 % ophthalmic solution; Place 2 drops into both eyes 2 (two) times daily for 10 days.

## 2018-05-28 NOTE — Patient Instructions (Signed)
Allergic Conjunctivitis, Adult      Allergic conjunctivitis is inflammation of the clear membrane that covers the white part of your eye and the inner surface of your eyelid (conjunctiva). The inflammation is caused by allergies. The blood vessels in the conjunctiva become inflamed and this causes the eyes to become red or pink. The eyes often feel itchy. Allergic conjunctivitis cannot be spread from one person to another person (is not contagious). What are the causes? This condition is caused by an allergic reaction. Common causes of an allergic reaction (allergens) include:  Outdoor allergens, such as: ? Pollen. ? Grass and weeds. ? Mold spores.  Indoor allergens, such as: ? Dust. ? Smoke. ? Mold. ? Pet dander. ? Animal hair.  What increases the risk? You may be more likely to develop this condition if you have a family history of allergies, such as:  Allergic rhinitis.  Bronchial asthma.  Atopic dermatitis.  What are the signs or symptoms? Symptoms of this condition include eyes that are:  Itchy.  Red.  Watery.  Puffy.  Your eyes may also:  Sting or burn.  Have clear drainage coming from them.  How is this diagnosed? This condition may be diagnosed by medical history and physical exam. If you have drainage from your eyes, it may be tested to rule out other causes of conjunctivitis. You may also need to see a health care provider who specializes in treating allergies (allergist) or eye conditions (ophthalmologist) for tests to confirm the diagnosis. You may have:  Skin tests to see which allergens are causing your symptoms. These tests involve pricking the skin with a tiny needle and exposing the skin to small amounts of potential allergens to see if your skin reacts.  Blood tests.  Tissue scrapings from your eyelid. These will be examined under a microscope.  How is this treated? Treatments for this condition may include:  Cold cloths (compresses) to  soothe itching and swelling.  Washing the face to remove allergens.  Eye drops. These may be prescription or over-the-counter. There are several different types. You may need to try different types to see which one works best for you. Your may need: ? Eye drops that block the allergic reaction (antihistamine). ? Eye drops that reduce swelling and irritation (anti-inflammatory). ? Steroid eye drops to lessen a severe reaction (vernal conjunctivitis).  Oral antihistamine medicines to reduce your allergic reaction. You may need these if eye drops do not help or are difficult to use.  Follow these instructions at home:  Avoid known allergens whenever possible.  Take or apply over-the-counter and prescription medicines only as told by your health care provider. These include any eye drops.  Apply a cool, clean washcloth to your eye for 10-20 minutes, 3-4 times a day.  Do not touch or rub your eyes.  Do not wear contact lenses until the inflammation is gone. Wear glasses instead.  Do not wear eye makeup until the inflammation is gone.  Keep all follow-up visits as told by your health care provider. This is important. Contact a health care provider if:  Your symptoms get worse or do not improve with treatment.  You have mild eye pain.  You have sensitivity to light.  You have spots or blisters on your eyes.  You have pus draining from your eye.  You have a fever. Get help right away if:  You have redness, swelling, or other symptoms in only one eye.  Your vision is blurred or you have   vision changes.  You have severe eye pain. This information is not intended to replace advice given to you by your health care provider. Make sure you discuss any questions you have with your health care provider. Document Released: 01/12/2003 Document Revised: 06/20/2016 Document Reviewed: 05/04/2016 Elsevier Interactive Patient Education  2018 [employer] Inc.  

## 2018-06-04 ENCOUNTER — Ambulatory Visit (INDEPENDENT_AMBULATORY_CARE_PROVIDER_SITE_OTHER): Payer: Self-pay | Admitting: Medical

## 2018-06-04 VITALS — BP 112/74 | HR 72 | Temp 98.1°F | Resp 20

## 2018-06-04 DIAGNOSIS — B9689 Other specified bacterial agents as the cause of diseases classified elsewhere: Secondary | ICD-10-CM

## 2018-06-04 DIAGNOSIS — H109 Unspecified conjunctivitis: Secondary | ICD-10-CM

## 2018-06-04 MED ORDER — CIPROFLOXACIN HCL 0.3 % OP SOLN: OPHTHALMIC | 0 refills | Status: DC

## 2018-06-04 NOTE — Progress Notes (Signed)
l  Subjective:    Patient ID: Analyse Angst, female    DOB: 28-Mar-1991, 27 y.o.   MRN: [id number]  HPI 27 yo female in non acute distress. Seen on  05/28/18  ( one week ago)and diagnosed with allergic conjunctivitis Placed on Azelastine 0.05% Ophthalmic solution 2 drops each eye tow times daily for 10 days. Right eye more blurry  ( x one week looking at a distance but not close up ) than left and painful but much improved since her last visit. History of [facility] Patient virus in her eye at age 56 yo. Placed on antibiotics at that time, otherwise history unremarkable.. No sensation of foreign body. No discharge. Never did have discharge.  Mild itchiness but better than last visit. They did feel initially swollen but do not now. Overall redness is much improved per patient. They said they would put me on an antibiotic if not improving.  Review of Systems  Constitutional: Negative for chills and fever.  HENT: Negative for congestion, ear pain and sore throat.   Eyes: Positive for pain (right more than left), redness (right more than left), itching (better) and visual disturbance (blurry on right side.). Negative for photophobia and discharge.  Respiratory: Negative for cough and shortness of breath.   Cardiovascular: Negative for palpitations and leg swelling.  Gastrointestinal: Negative for abdominal pain.  Musculoskeletal: Negative for myalgias.  Skin: Negative for rash.  Allergic/Immunologic: Negative for environmental allergies and food allergies.  Neurological: Negative for dizziness, syncope, light-headedness and headaches.       Objective:   Physical Exam  Constitutional: She is oriented to person, place, and time. She appears well-developed and well-nourished.  HENT:  Head: Normocephalic and atraumatic.  Eyes: Pupils are equal, round, and reactive to light. EOM and lids are normal. Right eye exhibits no chemosis, no discharge and no exudate. Left eye exhibits no chemosis, no  discharge and no exudate. No scleral icterus.    Neck: Normal range of motion. Neck supple.  Cardiovascular: Normal rate, regular rhythm and normal heart sounds.  Pulmonary/Chest: Effort normal and breath sounds normal.  Lymphadenopathy:    She has no cervical adenopathy.  Neurological: She is alert and oriented to person, place, and time.  Skin: Skin is warm and dry.  Psychiatric: She has a normal mood and affect. Her behavior is normal. Judgment and thought content normal.  Nursing note and vitals reviewed.  Bilateral injected right more than left .  Left 20/13 Right 2015   20/13 visual acuity both eyes  Assessment & Plan:  Bacterial conjunctivitis bilateral. Hygiene reviewed with patient. Meds ordered this encounter  Medications  . ciprofloxacin (CILOXAN) 0.3 % ophthalmic solution    Sig: Administer 1 drop both eyes, every 4 hours while awake    Dispense:  5 mL    Refill:  0  Return in 2 days for a recheck . Patient verbalizes understanding and has no questions at discharge. She states she will go online to register.

## 2018-06-04 NOTE — Patient Instructions (Addendum)
Wash hands before and after treatment. Return in 2 days for a recheck.  Bacterial Conjunctivitis Bacterial conjunctivitis is an infection of your conjunctiva. This is the clear membrane that covers the white part of your eye and the inner surface of your eyelid. This condition can make your eye:  Red or pink.  Itchy.  This condition is caused by bacteria. This condition spreads very easily from person to person (is contagious) and from one eye to the other eye. Follow these instructions at home: Medicines  Take or apply your antibiotic medicine as told by your doctor. Do not stop taking or applying the antibiotic even if you start to feel better.  Take or apply over-the-counter and prescription medicines only as told by your doctor.  Do not touch your eyelid with the eye drop bottle or the ointment tube. Managing discomfort  Wipe any fluid from your eye with a warm, wet washcloth or a cotton ball.  Place a cool, clean washcloth on your eye. Do this for 10-20 minutes, 3-4 times per day. General instructions  Do not wear contact lenses until the irritation is gone. Wear glasses until your doctor says it is okay to wear contacts.  Do not wear eye makeup until your symptoms are gone. Throw away any old makeup.  Change or wash your pillowcase every day.  Do not share towels or washcloths with anyone.  Wash your hands often with soap and water. Use paper towels to dry your hands.  Do not touch or rub your eyes.  Do not drive or use heavy machinery if your vision is blurry. Contact a doctor if:  You have a fever.  Your symptoms do not get better after 10 days. Get help right away if:  You have a fever and your symptoms suddenly get worse.  You have very bad pain when you move your eye.  Your face: ? Hurts. ? Is red. ? Is swollen.  You have sudden loss of vision. This information is not intended to replace advice given to you by your health care provider. Make sure you  discuss any questions you have with your health care provider. Document Released: 07/31/2008 Document Revised: 03/29/2016 Document Reviewed: 08/04/2015 Elsevier Interactive Patient Education  [employer].

## 2018-06-09 ENCOUNTER — Ambulatory Visit (INDEPENDENT_AMBULATORY_CARE_PROVIDER_SITE_OTHER): Payer: No Typology Code available for payment source | Admitting: Internal Medicine

## 2018-06-09 ENCOUNTER — Encounter: Payer: Self-pay | Admitting: Internal Medicine

## 2018-06-09 VITALS — BP 110/80 | HR 96 | Temp 98.2°F | Ht 68.0 in | Wt 162.0 lb

## 2018-06-09 DIAGNOSIS — H1033 Unspecified acute conjunctivitis, bilateral: Secondary | ICD-10-CM

## 2018-06-09 MED ORDER — ERYTHROMYCIN 5 MG/GM OP OINT: 1.0000 "application " | TOPICAL_OINTMENT | Freq: Four times a day (QID) | OPHTHALMIC | 0 refills | Status: DC

## 2018-06-09 NOTE — Patient Instructions (Addendum)
Check on MMR titer and call OB/GYN to sch appt   Bacterial Conjunctivitis Bacterial conjunctivitis is an infection of your conjunctiva. This is the clear membrane that covers the white part of your eye and the inner surface of your eyelid. This condition can make your eye:  Red or pink.  Itchy.  This condition is caused by bacteria. This condition spreads very easily from person to person (is contagious) and from one eye to the other eye. Follow these instructions at home: Medicines  Take or apply your antibiotic medicine as told by your doctor. Do not stop taking or applying the antibiotic even if you start to feel better.  Take or apply over-the-counter and prescription medicines only as told by your doctor.  Do not touch your eyelid with the eye drop bottle or the ointment tube. Managing discomfort  Wipe any fluid from your eye with a warm, wet washcloth or a cotton ball.  Place a cool, clean washcloth on your eye. Do this for 10-20 minutes, 3-4 times per day. General instructions  Do not wear contact lenses until the irritation is gone. Wear glasses until your doctor says it is okay to wear contacts.  Do not wear eye makeup until your symptoms are gone. Throw away any old makeup.  Change or wash your pillowcase every day.  Do not share towels or washcloths with anyone.  Wash your hands often with soap and water. Use paper towels to dry your hands.  Do not touch or rub your eyes.  Do not drive or use heavy machinery if your vision is blurry. Contact a doctor if:  You have a fever.  Your symptoms do not get better after 10 days. Get help right away if:  You have a fever and your symptoms suddenly get worse.  You have very bad pain when you move your eye.  Your face: ? Hurts. ? Is red. ? Is swollen.  You have sudden loss of vision. This information is not intended to replace advice given to you by your health care provider. Make sure you discuss any questions  you have with your health care provider. Document Released: 07/31/2008 Document Revised: 03/29/2016 Document Reviewed: 08/04/2015 Elsevier Interactive Patient Education  2018 [employer].  Viral Conjunctivitis, Adult Viral conjunctivitis is an inflammation of the clear membrane that covers the white part of your eye and the inner surface of your eyelid (conjunctiva). The inflammation is caused by a viral infection. The blood vessels in the conjunctiva become inflamed, causing the eye to become red or pink, and often itchy. Viral conjunctivitis can be easily passed from one person to another (is contagious). This condition is often called pink eye. What are the causes? This condition is caused by a virus. A virus is a type of contagious germ. It can be spread by touching objects that have been contaminated with the virus, such as doorknobs or towels. It can also be passed through droplets, such as from coughing or sneezing. What are the signs or symptoms? Symptoms of this condition include:  Eye redness.  Tearing or watery eyes.  Itchy and irritated eyes.  Burning feeling in the eyes.  Clear drainage from the eye.  Swollen eyelids.  A gritty feeling in the eye.  Light sensitivity.  This condition often occurs with other symptoms, such as a fever, nausea, or a rash. How is this diagnosed? This condition is diagnosed with a medical history and physical exam. If you have discharge from your eye, the discharge  may be tested to rule out other causes of conjunctivitis. How is this treated? Viral conjunctivitis does not respond to medicines that kill bacteria (antibiotics). Treatment for viral conjunctivitis is directed at stopping a bacterial infection from developing in addition to the viral infection. Treatment also aims to relieve your symptoms, such as itching. This may be done with antihistamine drops or other eye medicines. Rarely, steroid eye drops or antiviral medicines may be  prescribed. Follow these instructions at home: Medicines   Take or apply over-the-counter and prescription medicines only as told by your health care provider.  Be very careful to avoid touching the edge of the eyelid with the eye drop bottle or ointment tube when applying medicines to the affected eye. Being careful this way will stop you from spreading the infection to the other eye or to other people. Eye care  Avoid touching or rubbing your eyes.  Apply a warm, wet, clean washcloth to your eye for 10-20 minutes, 3-4 times per day or as told by your health care provider.  If you wear contact lenses, do not wear them until the inflammation is gone and your health care provider says it is safe to wear them again. Ask your health care provider how to sterilize or replace your contact lenses before using them again. Wear glasses until you can resume wearing contacts.  Avoid wearing eye makeup until the inflammation is gone. Throw away any old eye cosmetics that may be contaminated.  Gently wipe away any drainage from your eye with a warm, wet washcloth or a cotton ball. General instructions  Change or wash your pillowcase every day or as told by your health care provider.  Do not share towels, pillowcases, washcloths, eye makeup, makeup brushes, contact lenses, or glasses. This may spread the infection.  Wash your hands often with soap and water. Use paper towels to dry your hands. If soap and water are not available, use hand sanitizer.  Try to avoid contact with other people for one week or as told by your health care provider. Contact a health care provider if:  Your symptoms do not improve with treatment or they get worse.  You have increased pain.  Your vision becomes blurry.  You have a fever.  You have facial pain, redness, or swelling.  You have yellow or green drainage coming from your eye.  You have new symptoms. This information is not intended to replace advice  given to you by your health care provider. Make sure you discuss any questions you have with your health care provider. Document Released: 01/12/2003 Document Revised: 05/19/2016 Document Reviewed: 05/08/2016 Elsevier Interactive Patient Education  [employer].

## 2018-06-09 NOTE — Progress Notes (Signed)
Chief Complaint  Patient presents with  . Blurred Vision   Red, eye, blurry vision b/l eyes x 3 weeks and sensitive to light. Kids nor husband have sx's no new makeups, nail polish has tried cool compress w/o relief and ciloxan eye drops given by employee health but made worse and also gave her nose bleeds. Of note age 27 y.o had EBV in the ey   Review of Systems  Constitutional: Negative for fever.  Eyes: Positive for blurred vision and redness. Negative for pain.  Respiratory: Negative for cough and shortness of breath.   Cardiovascular: Negative for chest pain.   Past Medical History:  Diagnosis Date  . Adenocarcinoma in situ (AIS) of uterine cervix    s/p total hysteretectomy including uterus, cervix fallopian tubes 05/2017 Dr. Georgianne Fick ovaries intact   . Cancer ([place])   . Complication of anesthesia   . Family history of adverse reaction to anesthesia    SISTER-HARD TIME WAKING UP  . Medical history non-contributory   . PONV (postoperative nausea and vomiting) 09/2016   X 1 AFTER BTL   Past Surgical History:  Procedure Laterality Date  . ABDOMINAL HYSTERECTOMY     05/2017 Dr. Georgianne Fick   . APPENDECTOMY     2010  . CERVICAL CONE BIOPSY  03/05/2017   [facility], [facility]  . CERVICAL CONIZATION W/BX N/A 03/05/2017   Procedure: CONIZATION CERVIX WITH BIOPSY;  Surgeon: Malachy Mood, MD;  Location: [facility];  Service: Gynecology;  Laterality: N/A;  . CYSTOSCOPY N/A 05/07/2017   Procedure: CYSTOSCOPY;  Surgeon: Malachy Mood, MD;  Location: [facility];  Service: Gynecology;  Laterality: N/A;  . LAPAROSCOPIC HYSTERECTOMY Bilateral 05/07/2017   Procedure: HYSTERECTOMY TOTAL LAPAROSCOPIC BILATERAL SALPINGECTOMY;  Surgeon: Malachy Mood, MD;  Location: [facility];  Service: Gynecology;  Laterality: Bilateral;  . LAPAROSCOPIC TUBAL LIGATION Bilateral 09/25/2016   Procedure: LAPAROSCOPIC TUBAL LIGATION;  Surgeon: Malachy Mood, MD;  Location: [facility];  Service: Gynecology;  Laterality:  Bilateral;  . TONSILLECTOMY    . TUBAL LIGATION  09/2016  . WISDOM TOOTH EXTRACTION     Family History  Problem Relation Age of Onset  . Colon cancer Father 54  . Lung cancer Father   . Rectal cancer Father   . Cancer Father        stage 4 colon dx age 70   . Hyperlipidemia Mother   . Hypertension Mother   . Cancer Paternal Grandfather        colon cancer    Social History   Socioeconomic History  . Marital status: Single    Spouse name: Not on file  . Number of children: Not on file  . Years of education: Not on file  . Highest education level: Not on file  Occupational History  . Not on file  Social Needs  . Financial resource strain: Not on file  . Food insecurity:    Worry: Not on file    Inability: Not on file  . Transportation needs:    Medical: Not on file    Non-medical: Not on file  Tobacco Use  . Smoking status: Never Smoker  . Smokeless tobacco: Never Used  Substance and Sexual Activity  . Alcohol use: No  . Drug use: No  . Sexual activity: Yes    Birth control/protection: Surgical    Comment: BTL  Lifestyle  . Physical activity:    Days per week: Not on file    Minutes per session: Not on file  . Stress: Not  on file  Relationships  . Social connections:    Talks on phone: Not on file    Gets together: Not on file    Attends religious service: Not on file    Active member of club or organization: Not on file    Attends meetings of clubs or organizations: Not on file    Relationship status: Not on file  . Intimate partner violence:    Fear of current or ex partner: Not on file    Emotionally abused: Not on file    Physically abused: Not on file    Forced sexual activity: Not on file  Other Topics Concern  . Not on file  Social History Narrative   Married 2 kids girls    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 , 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    No drinking smoking    No guns, wears seatbelts    Safe in relationship    No outpatient medications have been marked  as taking for the 06/09/18 encounter (Office Visit) with [facility]-Sc[facility]uz[facility], Nino Glow, MD.   Allergies  Allergen Reactions  . Sulfamethoxazole-Trimethoprim Rash   No results found for this or any previous visit (from the past 2160 hour(s)). Objective  Body mass index is 24.63 kg/m. Wt Readings from Last 3 Encounters:  06/09/18 162 lb (73.5 kg)  05/28/18 162 lb (73.5 kg)  01/21/18 151 lb 12 oz (68.8 kg)   Temp Readings from Last 3 Encounters:  06/09/18 98.2 F (36.8 C) (Oral)  06/04/18 98.1 F (36.7 C) (Oral)  05/28/18 98.2 F (36.8 C)   BP Readings from Last 3 Encounters:  06/09/18 110/80  06/04/18 112/74  05/28/18 120/80   Pulse Readings from Last 3 Encounters:  06/09/18 96  06/04/18 72  05/28/18 67    Physical Exam  Constitutional: She is oriented to person, place, and time. Vital signs are normal. She appears well-developed and well-nourished. She is cooperative.  HENT:  Head: Normocephalic and atraumatic.  Mouth/Throat: Oropharynx is clear and moist and mucous membranes are normal.  Eyes: Pupils are equal, round, and reactive to light. Right eye exhibits no discharge. Left eye exhibits no discharge. Right conjunctiva is injected. Left conjunctiva is injected. No scleral icterus.  Cardiovascular: Normal rate, regular rhythm and normal heart sounds.  Pulmonary/Chest: Effort normal and breath sounds normal.  Neurological: She is alert and oriented to person, place, and time.  Skin: Skin is warm, dry and intact.  Psychiatric: She has a normal mood and affect. Her speech is normal and behavior is normal. Judgment and thought content normal. Cognition and memory are normal.  Nursing note and vitals reviewed.   Assessment   1. Conjunctivitis b/l eyes ? Viral (I.e reactivation of EBV had age 29 y.o), bacterial, allergic/irritant though no new products uses Plan   1. Trial E mycin qid to 6x per day 5-7 days  Urgent referral to [facility]    Provider: Dr. Olivia Mackie  [facility]

## 2018-06-09 NOTE — Progress Notes (Signed)
Pre visit review using our clinic review tool, if applicable. No additional management support is needed unless otherwise documented below in the visit note. 

## 2018-07-04 ENCOUNTER — Encounter: Payer: Self-pay | Admitting: Obstetrics and Gynecology

## 2018-07-04 ENCOUNTER — Other Ambulatory Visit (HOSPITAL_COMMUNITY)
Admission: RE | Admit: 2018-07-04 | Discharge: 2018-07-04 | Disposition: A | Discharge status: Home / Self Care | Payer: No Typology Code available for payment source | Source: Ambulatory Visit | Attending: Obstetrics and Gynecology | Admitting: Obstetrics and Gynecology

## 2018-07-04 ENCOUNTER — Ambulatory Visit (INDEPENDENT_AMBULATORY_CARE_PROVIDER_SITE_OTHER): Payer: No Typology Code available for payment source | Admitting: Obstetrics and Gynecology

## 2018-07-04 VITALS — BP 126/90 | HR 114 | Ht 68.0 in | Wt 162.0 lb

## 2018-07-04 DIAGNOSIS — Z1231 Encounter for screening mammogram for malignant neoplasm of breast: Secondary | ICD-10-CM | POA: Diagnosis not present

## 2018-07-04 DIAGNOSIS — Z124 Encounter for screening for malignant neoplasm of cervix: Secondary | ICD-10-CM | POA: Insufficient documentation

## 2018-07-04 DIAGNOSIS — R17 Unspecified jaundice: Secondary | ICD-10-CM | POA: Diagnosis not present

## 2018-07-04 DIAGNOSIS — Z1239 Encounter for other screening for malignant neoplasm of breast: Secondary | ICD-10-CM

## 2018-07-04 DIAGNOSIS — Z01419 Encounter for gynecological examination (general) (routine) without abnormal findings: Secondary | ICD-10-CM

## 2018-07-04 NOTE — Progress Notes (Signed)
Gynecology Annual Exam   PCP: McLean-Scocuzza, Nino Glow, MD  Chief Complaint:  Chief Complaint  Patient presents with  . Gynecologic Exam    History of Present Illness: Patient is a 27 y.o. G2P2002 presents for annual exam. The patient has no complaints today.   LMP: Patient's last menstrual period was 02/06/2017 (approximate). Absent secondary to prior hysterectomy  The patient is sexually active. She currently uses status post hysterectomy for contraception. She denies dyspareunia.  The patient does perform self breast exams.  There is no notable family history of breast or ovarian cancer in her family.  The patient wears seatbelts: yes.   The patient has regular exercise: not asked.    The patient denies current symptoms of depression.    Review of Systems: Review of Systems  Constitutional: Negative for chills and fever.  HENT: Negative for congestion.   Eyes: Negative.   Respiratory: Negative for cough and shortness of breath.   Cardiovascular: Negative for chest pain and palpitations.  Gastrointestinal: Negative for abdominal pain, constipation, diarrhea, heartburn, nausea and vomiting.  Genitourinary: Negative for dysuria, frequency and urgency.  Skin: Negative for itching and rash.  Neurological: Negative for dizziness and headaches.  Endo/Heme/Allergies: Negative for polydipsia.  Psychiatric/Behavioral: Negative for depression.    Past Medical History:  Past Medical History:  Diagnosis Date  . Adenocarcinoma in situ (AIS) of uterine cervix    s/p total hysteretectomy including uterus, cervix fallopian tubes 05/2017 Dr. Georgianne Fick ovaries intact   . Cancer ([place])   . Complication of anesthesia   . Family history of adverse reaction to anesthesia    SISTER-HARD TIME WAKING UP  . Medical history non-contributory   . PONV (postoperative nausea and vomiting) 09/2016   X 1 AFTER BTL    Past Surgical History:  Past Surgical History:  Procedure Laterality Date    . ABDOMINAL HYSTERECTOMY     05/2017 Dr. Georgianne Fick   . APPENDECTOMY     2010  . CERVICAL CONE BIOPSY  03/05/2017   [facility]  . CERVICAL CONIZATION W/BX N/A 03/05/2017   Procedure: CONIZATION CERVIX WITH BIOPSY;  Surgeon: Malachy Mood, MD;  Location: [facility] ORS;  Service: Gynecology;  Laterality: N/A;  . CYSTOSCOPY N/A 05/07/2017   Procedure: CYSTOSCOPY;  Surgeon: Malachy Mood, MD;  Location: [facility] ORS;  Service: Gynecology;  Laterality: N/A;  . LAPAROSCOPIC HYSTERECTOMY Bilateral 05/07/2017   Procedure: HYSTERECTOMY TOTAL LAPAROSCOPIC BILATERAL SALPINGECTOMY;  Surgeon: Malachy Mood, MD;  Location: [facility] ORS;  Service: Gynecology;  Laterality: Bilateral;  . LAPAROSCOPIC TUBAL LIGATION Bilateral 09/25/2016   Procedure: LAPAROSCOPIC TUBAL LIGATION;  Surgeon: Malachy Mood, MD;  Location: [facility] OR[facility];  Service: Gynecology;  Laterality: Bilateral;  . TONSILLECTOMY    . TUBAL LIGATION  09/2016  . WISDOM TOOTH EXTRACTION      Gynecologic History:  Patient's last menstrual period was 02/06/2017 (approximate). Contraception: status post hysterectomy  Obstetric History: G2P2002  Family History:  Family History  Problem Relation Age of Onset  . Colon cancer Father 14  . Lung cancer Father   . Rectal cancer Father   . Hyperlipidemia Mother   . Hypertension Mother   . Cancer Paternal Grandfather        colon cancer     Social History:  Social History   Socioeconomic History  . Marital status: Single    Spouse name: Not on file  . Number of children: Not on file  . Years of education: Not on file  . Highest education  level: Not on file  Occupational History  . Not on file  Social Needs  . Financial resource strain: Not on file  . Food insecurity:    Worry: Not on file    Inability: Not on file  . Transportation needs:    Medical: Not on file    Non-medical: Not on file  Tobacco Use  . Smoking status: Never Smoker  . Smokeless tobacco: Never Used  Substance and  Sexual Activity  . Alcohol use: No  . Drug use: No  . Sexual activity: Yes    Birth control/protection: Surgical    Comment: BTL  Lifestyle  . Physical activity:    Days per week: Not on file    Minutes per session: Not on file  . Stress: Not on file  Relationships  . Social connections:    Talks on phone: Not on file    Gets together: Not on file    Attends religious service: Not on file    Active member of club or organization: Not on file    Attends meetings of clubs or organizations: Not on file    Relationship status: Not on file  . Intimate partner violence:    Fear of current or ex partner: Not on file    Emotionally abused: Not on file    Physically abused: Not on file    Forced sexual activity: Not on file  Other Topics Concern  . Not on file  Social History Narrative   Married 2 kids girls    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[facility] , 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    No drinking smoking    No guns, wears seatbelts    Safe in relationship     Allergies:  Allergies  Allergen Reactions  . Sulfamethoxazole-Trimethoprim Rash    Medications: Prior to Admission medications   Medication Sig Start Date End Date Taking? Authorizing Provider  prednisoLONE acetate (PRED FORTE) 1 % ophthalmic suspension  06/26/18  Yes [provider]    Physical Exam Vitals: Blood pressure 126/90, pulse (!) 114, height 5\' 8"  (1.727 m), weight 162 lb (73.5 kg), last menstrual period 02/06/2017, not currently breastfeeding.  General: NAD HEENT: normocephalic, anicteric Thyroid: no enlargement, no palpable nodules Pulmonary: No increased work of breathing, CTAB Cardiovascular: RRR, distal pulses 2+ Breast: Breast symmetrical, no tenderness, no palpable nodules or masses, no skin or nipple retraction present, no nipple discharge.  No axillary or supraclavicular lymphadenopathy. Abdomen: NABS, soft, non-tender, non-distended.  Umbilicus without lesions.  No hepatomegaly, splenomegaly or masses palpable.  No evidence of hernia  Genitourinary:  External: Normal external female genitalia.  Normal urethral meatus, normal Bartholin's and Skene's glands.    Vagina: Normal vaginal mucosa, no evidence of prolapse.    Cervix: surgically absent  Uterus: surgically absent  Adnexa: ovaries non-enlarged, no adnexal masses  Rectal: deferred  Lymphatic: no evidence of inguinal lymphadenopathy Extremities: no edema, erythema, or tenderness Neurologic: Grossly intact Psychiatric: mood appropriate, affect full  Female chaperone present for pelvic and breast  portions of the physical exam    Assessment: 27 y.o. G2P2002 routine annual exam  Plan: Problem List Items Addressed This Visit    None    Visit Diagnoses    Screening for malignant neoplasm of cervix    -  Primary   Relevant Orders   Cytology - PAP   Encounter for gynecological examination without abnormal finding       Breast screening       Serum total bilirubin elevated  Relevant Orders   Comprehensive metabolic panel      2) STI screening  was not offered and therefore not obtained  2)  ASCCP guidelines and rational discussed.  Patient opts for every 3 years screening interval  - Women with a history of CIN2 or amore severe diagnosis shouldcontinue routine screening for at least 20 years.   [place], [place] for Colposcopy and Cervical Pathology, and American Society for Clinical Pathology Screening Guidelines for the Prevention and Early Detection of Cervical Cancer 2012 Consensus Statement  3) Contraception - N/A s/p hysterectomy  4) Routine healthcare maintenance including cholesterol, diabetes screening discussed managed by PCP  5)  Elevated total bilirubin 2.2 on 12/25/17 - will repeat today.  If normal no further follow up.  Discussed that bilirubin is a breakdown product of RBCs, elevation in bilirubin may be secondary to increased red blood cell turnover/destruction or problems with clearance  of the breakdown product (biliary obstruction or metabolic disorders).   She has not noted any scleral icterus, jaundice, or right upper quadrant pain.  6) Return in about 1 year (around 07/05/2019) for Annual.   Malachy Mood, MD, [place] 07/04/2018, 11:04 AM

## 2018-07-05 ENCOUNTER — Other Ambulatory Visit: Payer: Self-pay | Admitting: Obstetrics and Gynecology

## 2018-07-05 LAB — COMPREHENSIVE METABOLIC PANEL
A/G RATIO: 2.1 (ref 1.2–2.2)
ALBUMIN: 4.6 g/dL (ref 3.5–5.5)
ALT: 12 IU/L (ref 0–32)
AST: 14 IU/L (ref 0–40)
Alkaline Phosphatase: 57 IU/L (ref 39–117)
BILIRUBIN TOTAL: 1.6 mg/dL — AB (ref 0.0–1.2)
BUN / CREAT RATIO: 15 (ref 9–23)
BUN: 11 mg/dL (ref 6–20)
CHLORIDE: 102 mmol/L (ref 96–106)
CO2: 26 mmol/L (ref 20–29)
Calcium: 10 mg/dL (ref 8.7–10.2)
Creatinine, Ser: 0.73 mg/dL (ref 0.57–1.00)
GFR calc non Af Amer: 113 mL/min/{1.73_m2} (ref >59)
GFR, EST AFRICAN AMERICAN: 131 mL/min/{1.73_m2} (ref >59)
GLOBULIN, TOTAL: 2.2 g/dL (ref 1.5–4.5)
Glucose: 92 mg/dL (ref 65–99)
POTASSIUM: 4.3 mmol/L (ref 3.5–5.2)
Sodium: 141 mmol/L (ref 134–144)
TOTAL PROTEIN: 6.8 g/dL (ref 6.0–8.5)

## 2018-07-09 ENCOUNTER — Other Ambulatory Visit: Payer: Self-pay | Admitting: Obstetrics and Gynecology

## 2018-07-09 LAB — BILIRUBIN, DIRECT: BILIRUBIN, DIRECT: 0.28 mg/dL (ref 0.00–0.40)

## 2018-07-09 LAB — CYTOLOGY - PAP: Diagnosis: NEGATIVE

## 2018-07-09 LAB — SPECIMEN STATUS REPORT

## 2018-07-11 ENCOUNTER — Other Ambulatory Visit: Payer: No Typology Code available for payment source

## 2018-07-12 LAB — HAPTOGLOBIN: Haptoglobin: 72 mg/dL (ref 34–200)

## 2018-07-12 LAB — BILIRUBIN, DIRECT: BILIRUBIN, DIRECT: 0.23 mg/dL (ref 0.00–0.40)

## 2018-07-12 LAB — LACTATE DEHYDROGENASE: LDH: 180 IU/L (ref 119–226)

## 2018-12-17 ENCOUNTER — Ambulatory Visit (INDEPENDENT_AMBULATORY_CARE_PROVIDER_SITE_OTHER): Payer: Self-pay | Admitting: Physician Assistant

## 2018-12-17 ENCOUNTER — Encounter: Payer: Self-pay | Admitting: Physician Assistant

## 2018-12-17 VITALS — BP 100/82 | HR 63 | Temp 97.9°F | Resp 14 | Wt 158.0 lb

## 2018-12-17 DIAGNOSIS — R05 Cough: Secondary | ICD-10-CM

## 2018-12-17 DIAGNOSIS — J0100 Acute maxillary sinusitis, unspecified: Secondary | ICD-10-CM

## 2018-12-17 DIAGNOSIS — R059 Cough, unspecified: Secondary | ICD-10-CM

## 2018-12-17 MED ORDER — DOXYCYCLINE HYCLATE 100 MG PO TABS: 100.0000 mg | ORAL_TABLET | Freq: Two times a day (BID) | ORAL | 0 refills | Status: AC

## 2018-12-17 MED ORDER — BENZONATATE 200 MG PO CAPS: 200.0000 mg | ORAL_CAPSULE | Freq: Two times a day (BID) | ORAL | 0 refills | Status: DC | PRN

## 2018-12-17 MED ORDER — FLUTICASONE PROPIONATE 50 MCG/ACT NA SUSP: 2.0000 | Freq: Every day | NASAL | 0 refills | Status: DC

## 2018-12-17 NOTE — Patient Instructions (Signed)
Thank you for choosing [facility] for your health care needs.  You have been diagnosed with sinusitis.  Take antibiotic, Doxycycline, as prescribed. 1 tab twice a day x 7 days.  Use an over the counter decongestant, such as Sudafed, or Mucinex-D, or Claritin-D. Increase fluids. Rest. Continue to use Flonase nasal spray.  You have been prescribed Tessalon Perles for cough.  Follow-up with family physician or urgent care in 4-5 days if symptoms not improving, sooner with any worsening symptoms.  Hope you feel better soon!  Sinusitis, Adult Sinusitis is soreness and swelling (inflammation) of your sinuses. Sinuses are hollow spaces in the bones around your face. They are located:  Around your eyes.  In the middle of your forehead.  Behind your nose.  In your cheekbones. Your sinuses and nasal passages are lined with a fluid called mucus. Mucus drains out of your sinuses. Swelling can trap mucus in your sinuses. This lets germs (bacteria, virus, or fungus) grow, which leads to infection. Most of the time, this condition is caused by a virus. What are the causes? This condition is caused by:  Allergies.  Asthma.  Germs.  Things that block your nose or sinuses.  Growths in the nose (nasal polyps).  Chemicals or irritants in the air.  Fungus (rare). What increases the risk? You are more likely to develop this condition if:  You have a weak body defense system (immune system).  You do a lot of swimming or diving.  You use nasal sprays too much.  You smoke. What are the signs or symptoms? The main symptoms of this condition are pain and a feeling of pressure around the sinuses. Other symptoms include:  Stuffy nose (congestion).  Runny nose (drainage).  Swelling and warmth in the sinuses.  Headache.  Toothache.  A cough that may get worse at night.  Mucus that collects in the throat or the back of the nose (postnasal drip).  Being unable to smell and  taste.  Being very tired (fatigue).  A fever.  Sore throat.  Bad breath. How is this diagnosed? This condition is diagnosed based on:  Your symptoms.  Your medical history.  A physical exam.  Tests to find out if your condition is short-term (acute) or long-term (chronic). Your doctor may: ? Check your nose for growths (polyps). ? Check your sinuses using a tool that has a light (endoscope). ? Check for allergies or germs. ? Do imaging tests, such as an MRI or CT scan. How is this treated? Treatment for this condition depends on the cause and whether it is short-term or long-term.  If caused by a virus, your symptoms should go away on their own within 10 days. You may be given medicines to relieve symptoms. They include: ? Medicines that shrink swollen tissue in the nose. ? Medicines that treat allergies (antihistamines). ? A spray that treats swelling of the nostrils. ? Rinses that help get rid of thick mucus in your nose (nasal saline washes).  If caused by bacteria, your doctor may wait to see if you will get better without treatment. You may be given antibiotic medicine if you have: ? A very bad infection. ? A weak body defense system.  If caused by growths in the nose, you may need to have surgery. Follow these instructions at home: Medicines  Take, use, or apply over-the-counter and prescription medicines only as told by your doctor. These may include nasal sprays.  If you were prescribed an antibiotic medicine, take it  as told by your doctor. Do not stop taking the antibiotic even if you start to feel better. Hydrate and humidify   Drink enough water to keep your pee (urine) pale yellow.  Use a cool mist humidifier to keep the humidity level in your home above 50%.  Breathe in steam for 10-15 minutes, 3-4 times a day, or as told by your doctor. You can do this in the bathroom while a hot shower is running.  Try not to spend time in cool or dry  air. Rest  Rest as much as you can.  Sleep with your head raised (elevated).  Make sure you get enough sleep each night. General instructions   Put a warm, moist washcloth on your face 3-4 times a day, or as often as told by your doctor. This will help with discomfort.  Wash your hands often with soap and water. If there is no soap and water, use hand sanitizer.  Do not smoke. Avoid being around people who are smoking (secondhand smoke).  Keep all follow-up visits as told by your doctor. This is important. Contact a doctor if:  You have a fever.  Your symptoms get worse.  Your symptoms do not get better within 10 days. Get help right away if:  You have a very bad headache.  You cannot stop throwing up (vomiting).  You have very bad pain or swelling around your face or eyes.  You have trouble seeing.  You feel confused.  Your neck is stiff.  You have trouble breathing. Summary  Sinusitis is swelling of your sinuses. Sinuses are hollow spaces in the bones around your face.  This condition is caused by tissues in your nose that become inflamed or swollen. This traps germs. These can lead to infection.  If you were prescribed an antibiotic medicine, take it as told by your doctor. Do not stop taking it even if you start to feel better.  Keep all follow-up visits as told by your doctor. This is important. This information is not intended to replace advice given to you by your health care provider. Make sure you discuss any questions you have with your health care provider. Document Released: 04/09/2008 Document Revised: 03/24/2018 Document Reviewed: 03/24/2018 Elsevier Interactive Patient Education  2019 [employer].

## 2018-12-17 NOTE — Progress Notes (Signed)
Patient ID: Stefanie Gomez DOB: [birth year] AGE: 28 y.o. MRN: [id number]   PCP: McLean-Scocuzza, Nino Glow, MD   Chief Complaint:  Chief Complaint  Patient presents with  . sore throat and weakness    x1 week (theraflu, flonase and Nyquil)     Subjective:    HPI:  Stefanie Gomez is a 28 y.o. female presents for evaluation  Chief Complaint  Patient presents with  . sore throat and weakness    x1 week (theraflu, flonase and Nyquil)    28 year old female presents to [facility] with one week history of URI symptoms. Began with sore throat. Then developed nasal congestion and postnasal drip. Now has cough. Cough coarse and productive. Associated weakness, fatigue, and malaise. States she is sick of being sick. Feels she is getting no better. Cough causing difficulty sleeping. Has not missed any work. Has taken OTC TheraFlu, Flonase, and Nyquil with no relief. Denies fever, chills, body aches, headache, ear pain, sinus pain, chest pain, SOB, wheezing, abdominal pain, nausea/vomiting, diarrhea, rash. Patient did receive this season's influenza vaccination. Patient reports co-worker ill with similar URI symptoms. Patient denies smoking history. Denies asthma diagnosis. Denies seasonal allergies. Patient with influenza like symptoms this time last year, seen at [facility] on 12/09/2017; states current symptoms do not feel similar. Patient states last sinusitis episode, treated at [facility], on 10/12/2014.   A limited review of symptoms was performed, pertinent positives and negatives as mentioned in HPI.  The following portions of the patient's history were reviewed and updated as appropriate: allergies, current medications and past medical history.  Patient Active Problem List   Diagnosis Date Noted  . S/P laparoscopic hysterectomy 05/07/2017  . 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  Sulfamethoxazole-Trimethoprim Rash    No current outpatient medications on file prior to visit.   No current facility-administered medications on file prior to visit.        Objective:   Vitals:   12/17/18 1527  BP: 100/82  Pulse: 63  Resp: 14  Temp: 97.9 F (36.6 C)  SpO2: 100%     Wt Readings from Last 3 Encounters:  12/17/18 158 lb (71.7 kg)  07/04/18 162 lb (73.5 kg)  06/09/18 162 lb (73.5 kg)    Physical Exam:   General Appearance:  Patient sitting comfortably on examination table. Conversational. Kermit Balo self-historian. In no acute distress. Afebrile.   Head:  Normocephalic, without obvious abnormality, atraumatic  Eyes:  PERRL, conjunctiva/corneas clear, EOM's intact  Ears:  Bilateral ear canals WNL. No erythema or edema. No discharge/drainage. Bilateral TMs WNL. No erythema, injection, or serous effusion. No scar tissue.  Nose: Nares normal, septum midline. No discharge. Normal mucosa. No sinus tenderness with percussion/palpation.  Throat: Lips, mucosa, and tongue normal; teeth and gums normal. Throat reveals no erythema. Tonsils with no enlargement or exudate.  Neck: Supple, symmetrical, trachea midline, no adenopathy  Lungs:   Clear to auscultation bilaterally, respirations unlabored. Good aeration. No rales, rhonchi, crackles or wheezing. Occasional dry cough during examination.  Heart:  Regular rate and rhythm, S1 and S2 normal, no murmur, rub, or gallop  Extremities: Extremities normal, atraumatic, no cyanosis or edema  Pulses: 2+ and symmetric  Skin: Skin color, texture, turgor normal, no rashes or lesions  Lymph nodes: Cervical, supraclavicular, and axillary nodes normal  Neurologic: Normal    Assessment & Plan:    Exam findings, diagnosis etiology and medication use and indications reviewed  with patient. Follow-Up and discharge instructions provided. No emergent/urgent issues found on exam.  Patient education was provided.   Patient verbalized  understanding of information provided and agrees with plan of care (POC), all questions answered. The patient is advised to call or return to clinic if condition does not see an improvement in symptoms, or to seek the care of the closest emergency department if condition worsens with the below plan.    1. Acute non-recurrent maxillary sinusitis - doxycycline (VIBRA-TABS) 100 MG tablet; Take 1 tablet (100 mg total) by mouth 2 (two) times daily for 7 days.  Dispense: 14 tablet; Refill: 0 - fluticasone (FLONASE) 50 MCG/ACT nasal spray; Place 2 sprays into both nostrils daily.  Dispense: 16 g; Refill: 0  2. Cough - benzonatate (TESSALON) 200 MG capsule; Take 1 capsule (200 mg total) by mouth 2 (two) times daily as needed for cough.  Dispense: 20 capsule; Refill: 0  Patient with one week history of URI symptoms; sore throat, nasal congestion, sinus pressure, cough. VSS, afebrile, in no acute distress, clear lung sounds. Had discussion with patient, symptoms and PE suggestive of self-limited viral URI. However, patient with constitutional symptoms of malaise, fatigue, and "weakness". Also feels she is not taking a turn for the better, getting worse instead of better. Offered to prescribe antibiotic given duration of symptoms and associated malaise. Prescribed Doxycycline 100mg  bid x 7 days. Also prescribed Flonase nasal spray and Tessalon perles for symptom relief. Advised patient follow-up with PCP or urgent care in 4-5 days if symptoms not improving, sooner with worsening symptoms. Patient agreed with plan.   Darlin Priestly, MHS, PA-C Montey Hora, MHS, PA-C Advanced Practice Provider [facility]  [address], [facility], [place], [place] [address] (p):  [phone] [email or web address]

## 2018-12-19 ENCOUNTER — Telehealth: Payer: Self-pay | Admitting: Emergency Medicine

## 2018-12-19 NOTE — Telephone Encounter (Signed)
Spoke with patient whom informed me that she is feeling so much better. This was follow up call for [facility] visit

## 2019-02-26 ENCOUNTER — Encounter: Payer: Self-pay | Admitting: Internal Medicine

## 2019-02-27 ENCOUNTER — Other Ambulatory Visit: Payer: Self-pay | Admitting: Internal Medicine

## 2019-02-27 ENCOUNTER — Encounter (INDEPENDENT_AMBULATORY_CARE_PROVIDER_SITE_OTHER): Payer: No Typology Code available for payment source | Admitting: Internal Medicine

## 2019-02-27 DIAGNOSIS — H209 Unspecified iridocyclitis: Secondary | ICD-10-CM

## 2019-02-27 DIAGNOSIS — Z0000 Encounter for general adult medical examination without abnormal findings: Secondary | ICD-10-CM

## 2019-02-27 DIAGNOSIS — E559 Vitamin D deficiency, unspecified: Secondary | ICD-10-CM

## 2019-02-27 DIAGNOSIS — Z1329 Encounter for screening for other suspected endocrine disorder: Secondary | ICD-10-CM

## 2019-02-27 DIAGNOSIS — Z13220 Encounter for screening for lipoid disorders: Secondary | ICD-10-CM

## 2019-02-27 DIAGNOSIS — Z1389 Encounter for screening for other disorder: Secondary | ICD-10-CM

## 2019-02-27 NOTE — Telephone Encounter (Signed)
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 Dr. George Ina treats both eyes with a steroid drop called Prednisolone which clears it up but a couple weeks after treatment my eye is red, sensitive and blurry again. Also, prolonged use messes up eye pressure. I was told by Dr. George Ina what causes iritis is unknown but he thinks it's linked to an autoimmune disorder but did not want to do blood work at the time. I'm really hoping that you can help or run some test to find out what's going on or possibly refer me to another ophthalmologist as my eye is currently red right now.  Thanks!    Hello hope you are being safe   If I am doing labs to work this up we are doing charges to insurances for my chart messages, work up and treatment   I have ordered labs please call the office and schedule a lab visit and wear a mask to the lab here and in public   Please sch f/u with me either phone or video in 03/2019 to review labs call the office please   And call [facility] and left them know you are still having issues with your eyes  -if you no longer wish to see Dr. George Ina they have Dr. Wallace Going and other eye doctors there  -do you need a referral with your insurance to [facility] again?    Time spent 10 minutes  Thanks  [place]

## 2019-03-05 ENCOUNTER — Other Ambulatory Visit: Payer: Self-pay

## 2019-03-05 ENCOUNTER — Encounter: Payer: Self-pay | Admitting: Internal Medicine

## 2019-03-05 ENCOUNTER — Other Ambulatory Visit (INDEPENDENT_AMBULATORY_CARE_PROVIDER_SITE_OTHER): Payer: No Typology Code available for payment source

## 2019-03-05 DIAGNOSIS — Z1389 Encounter for screening for other disorder: Secondary | ICD-10-CM | POA: Diagnosis not present

## 2019-03-05 DIAGNOSIS — Z1329 Encounter for screening for other suspected endocrine disorder: Secondary | ICD-10-CM

## 2019-03-05 DIAGNOSIS — E559 Vitamin D deficiency, unspecified: Secondary | ICD-10-CM

## 2019-03-05 DIAGNOSIS — H209 Unspecified iridocyclitis: Secondary | ICD-10-CM | POA: Diagnosis not present

## 2019-03-05 DIAGNOSIS — Z13220 Encounter for screening for lipoid disorders: Secondary | ICD-10-CM | POA: Diagnosis not present

## 2019-03-05 LAB — CBC WITH DIFFERENTIAL/PLATELET
Basophils Absolute: 0 10*3/uL (ref 0.0–0.1)
Basophils Relative: 0.6 % (ref 0.0–3.0)
Eosinophils Absolute: 0.1 10*3/uL (ref 0.0–0.7)
Eosinophils Relative: 1.1 % (ref 0.0–5.0)
HCT: 43.9 % (ref 36.0–46.0)
Hemoglobin: 14.8 g/dL (ref 12.0–15.0)
Lymphocytes Relative: 48 % — ABNORMAL HIGH (ref 12.0–46.0)
Lymphs Abs: 2.5 10*3/uL (ref 0.7–4.0)
MCHC: 33.6 g/dL (ref 30.0–36.0)
MCV: 89.6 fl (ref 78.0–100.0)
Monocytes Absolute: 0.4 10*3/uL (ref 0.1–1.0)
Monocytes Relative: 7.3 % (ref 3.0–12.0)
Neutro Abs: 2.2 10*3/uL (ref 1.4–7.7)
Neutrophils Relative %: 43 % (ref 43.0–77.0)
Platelets: 185 10*3/uL (ref 150.0–400.0)
RBC: 4.9 Mil/uL (ref 3.87–5.11)
RDW: 13.1 % (ref 11.5–15.5)
WBC: 5.2 10*3/uL (ref 4.0–10.5)

## 2019-03-05 LAB — COMPREHENSIVE METABOLIC PANEL
ALT: 11 U/L (ref 0–35)
AST: 15 U/L (ref 0–37)
Albumin: 4.8 g/dL (ref 3.5–5.2)
Alkaline Phosphatase: 54 U/L (ref 39–117)
BUN: 12 mg/dL (ref 6–23)
CO2: 26 mEq/L (ref 19–32)
Calcium: 9.7 mg/dL (ref 8.4–10.5)
Chloride: 105 mEq/L (ref 96–112)
Creatinine, Ser: 0.8 mg/dL (ref 0.40–1.20)
GFR: 85.39 mL/min (ref >60.00)
Glucose, Bld: 79 mg/dL (ref 70–99)
Potassium: 3.8 mEq/L (ref 3.5–5.1)
Sodium: 140 mEq/L (ref 135–145)
Total Bilirubin: 1.6 mg/dL — ABNORMAL HIGH (ref 0.2–1.2)
Total Protein: 7.4 g/dL (ref 6.0–8.3)

## 2019-03-05 LAB — LIPID PANEL
Cholesterol: 154 mg/dL (ref 0–200)
HDL: 63.3 mg/dL (ref >39.00)
LDL Cholesterol: 80 mg/dL (ref 0–99)
NonHDL: 90.33
Total CHOL/HDL Ratio: 2
Triglycerides: 50 mg/dL (ref 0.0–149.0)
VLDL: 10 mg/dL (ref 0.0–40.0)

## 2019-03-05 LAB — C-REACTIVE PROTEIN: CRP: <1 mg/dL (ref 0.5–20.0)

## 2019-03-05 LAB — T4, FREE: Free T4: 0.89 ng/dL (ref 0.60–1.60)

## 2019-03-05 LAB — TSH: TSH: 2.09 u[IU]/mL (ref 0.35–4.50)

## 2019-03-05 LAB — SEDIMENTATION RATE: Sed Rate: 4 mm/hr (ref 0–20)

## 2019-03-05 LAB — VITAMIN D 25 HYDROXY (VIT D DEFICIENCY, FRACTURES): VITD: 40.37 ng/mL (ref 30.00–100.00)

## 2019-03-05 NOTE — Addendum Note (Signed)
Addended by: Arby Barrette on: 03/05/2019 08:00 AM   Modules accepted: Orders

## 2019-03-06 LAB — URINALYSIS, ROUTINE W REFLEX MICROSCOPIC
Bilirubin, UA: NEGATIVE
Glucose, UA: NEGATIVE
Ketones, UA: NEGATIVE
Leukocytes,UA: NEGATIVE
Nitrite, UA: NEGATIVE
Protein,UA: NEGATIVE
RBC, UA: NEGATIVE
Specific Gravity, UA: 1.026 (ref 1.005–1.030)
Urobilinogen, Ur: 0.2 mg/dL (ref 0.2–1.0)
pH, UA: 5 (ref 5.0–7.5)

## 2019-03-07 LAB — HSV(HERPES SMPLX)ABS-I+II(IGG+IGM)-BLD
HSV 1 Glycoprotein G Ab, IgG: 4.19 index — ABNORMAL HIGH (ref 0.00–0.90)
HSV 2 IgG, Type Spec: <0.91 index (ref 0.00–0.90)
HSVI/II Comb IgM: <0.91 Ratio (ref 0.00–0.90)

## 2019-03-07 LAB — QUANTIFERON-TB GOLD PLUS
Mitogen-NIL: 7.51 IU/mL
NIL: 0.06 IU/mL
QuantiFERON-TB Gold Plus: NEGATIVE
TB1-NIL: <0 IU/mL
TB2-NIL: <0 IU/mL

## 2019-03-10 LAB — ROCKY MTN SPOTTED FVR ABS PNL(IGG+IGM)
RMSF IgG: NOT DETECTED
RMSF IgM: NOT DETECTED

## 2019-03-10 LAB — LYME AB SCREEN %: Lyme AB Screen: 1.56 index — ABNORMAL HIGH

## 2019-03-10 LAB — B. BURGDORFI ANTIBODIES BY WB
B burgdorferi IgG Abs (IB): NEGATIVE
B burgdorferi IgM Abs (IB): NEGATIVE
Lyme Disease 18 kD IgG: NONREACTIVE
Lyme Disease 23 kD IgG: NONREACTIVE
Lyme Disease 23 kD IgM: NONREACTIVE
Lyme Disease 28 kD IgG: NONREACTIVE
Lyme Disease 30 kD IgG: NONREACTIVE
Lyme Disease 39 kD IgG: NONREACTIVE
Lyme Disease 39 kD IgM: REACTIVE — AB
Lyme Disease 41 kD IgG: REACTIVE — AB
Lyme Disease 41 kD IgM: NONREACTIVE
Lyme Disease 45 kD IgG: NONREACTIVE
Lyme Disease 58 kD IgG: REACTIVE — AB
Lyme Disease 66 kD IgG: NONREACTIVE
Lyme Disease 93 kD IgG: NONREACTIVE

## 2019-03-10 LAB — ANA: Anti Nuclear Antibody (ANA): NEGATIVE

## 2019-03-10 LAB — RHEUMATOID FACTOR: Rhuematoid fact SerPl-aCnc: <14 IU/mL (ref <14)

## 2019-03-10 LAB — CYCLIC CITRUL PEPTIDE ANTIBODY, IGG: Cyclic Citrullin Peptide Ab: <16 UNITS

## 2019-03-10 LAB — HIV ANTIBODY (ROUTINE TESTING W REFLEX): HIV 1&2 Ab, 4th Generation: NONREACTIVE

## 2019-03-10 LAB — HLA-B27 ANTIGEN

## 2019-03-12 ENCOUNTER — Other Ambulatory Visit: Payer: No Typology Code available for payment source

## 2019-03-12 ENCOUNTER — Ambulatory Visit (INDEPENDENT_AMBULATORY_CARE_PROVIDER_SITE_OTHER): Payer: No Typology Code available for payment source | Admitting: Internal Medicine

## 2019-03-12 ENCOUNTER — Other Ambulatory Visit: Payer: Self-pay

## 2019-03-12 ENCOUNTER — Encounter: Payer: Self-pay | Admitting: Internal Medicine

## 2019-03-12 DIAGNOSIS — B009 Herpesviral infection, unspecified: Secondary | ICD-10-CM

## 2019-03-12 DIAGNOSIS — Z8619 Personal history of other infectious and parasitic diseases: Secondary | ICD-10-CM

## 2019-03-12 DIAGNOSIS — H209 Unspecified iridocyclitis: Secondary | ICD-10-CM

## 2019-03-12 NOTE — Progress Notes (Signed)
Virtual Visit via Video Note  I connected with Stefanie Gomez   on 03/12/19 at  3:04 PM EDT by a video enabled telemedicine application and verified that I am speaking with the correct person using two identifiers.  Location patient: car Location provider:work Persons participating in the virtual visit: patient, provider  I discussed the limitations of evaluation and management by telemedicine and the availability of in person appointments. The patient expressed understanding and agreed to proceed.   HPI: 1. Red eye and prev told [place] eye iritis and disc'ed labs which resulted +lyme with h/o tick bite behind right ear when living in [place] and also h/o HSV 1+ no h/o herpes outbreaks. Steroid drops given by eye MD make her vision blurry and she is worried about long term side effects. Eye had turned red 11 years ago then 06/2018 now every 2 weeks. When eye is red it feels like a sunburn and steroid drops make her vision blurry. She cant id trigger of what makes her eye red   She was told in the past had EBV exposure and will check for this as well as HLA B27  Will give her copy of blood work    ROS: See pertinent positives and negatives per HPI.  Past Medical History:  Diagnosis Date  . Adenocarcinoma in situ (AIS) of uterine cervix    s/p total hysteretectomy including uterus, cervix fallopian tubes 05/2017 Dr. Georgianne Fick ovaries intact   . Cancer ([place])   . Complication of anesthesia   . Family history of adverse reaction to anesthesia    SISTER-HARD TIME WAKING UP  . HSV-1 (herpes simplex virus 1) infection   . Medical history non-contributory   . PONV (postoperative nausea and vomiting) 09/2016   X 1 AFTER BTL    Past Surgical History:  Procedure Laterality Date  . ABDOMINAL HYSTERECTOMY     05/2017 Dr. Georgianne Fick   . APPENDECTOMY     2010  . CERVICAL CONE BIOPSY  03/05/2017   [facility], [facility]  . CERVICAL CONIZATION W/BX N/A 03/05/2017   Procedure: CONIZATION CERVIX WITH  BIOPSY;  Surgeon: Malachy Mood, MD;  Location: [facility] ORS;  Service: Gynecology;  Laterality: N/A;  . CYSTOSCOPY N/A 05/07/2017   Procedure: CYSTOSCOPY;  Surgeon: Malachy Mood, MD;  Location: [facility];  Service: Gynecology;  Laterality: N/A;  . LAPAROSCOPIC HYSTERECTOMY Bilateral 05/07/2017   Procedure: HYSTERECTOMY TOTAL LAPAROSCOPIC BILATERAL SALPINGECTOMY;  Surgeon: Malachy Mood, MD;  Location: [facility];  Service: Gynecology;  Laterality: Bilateral;  . LAPAROSCOPIC TUBAL LIGATION Bilateral 09/25/2016   Procedure: LAPAROSCOPIC TUBAL LIGATION;  Surgeon: Malachy Mood, MD;  Location: [facility];  Service: Gynecology;  Laterality: Bilateral;  . TONSILLECTOMY    . TUBAL LIGATION  09/2016  . WISDOM TOOTH EXTRACTION      Family History  Problem Relation Age of Onset  . Colon cancer Father 73  . Lung cancer Father   . Rectal cancer Father   . Hyperlipidemia Mother   . Hypertension Mother   . Cancer Paternal Grandfather        colon cancer     SOCIAL HX: married works [employer]   Current Outpatient Medications:  .  benzonatate (TESSALON) 200 MG capsule, Take 1 capsule (200 mg total) by mouth 2 (two) times daily as needed for cough., Disp: 20 capsule, Rfl: 0 .  fluticasone (FLONASE) 50 MCG/ACT nasal spray, Place 2 sprays into both nostrils daily., Disp: 16 g, Rfl: 0  EXAM:  VITALS per patient if applicable:  GENERAL:  alert, oriented, appears well and in no acute distress  HEENT: atraumatic, conjunttiva clear, no obvious abnormalities on inspection of external nose and ears Eye is not red currently   NECK: normal movements of the head and neck  LUNGS: on inspection no signs of respiratory distress, breathing rate appears normal, no obvious gross SOB, gasping or wheezing  CV: no obvious cyanosis  MS: moves all visible extremities without noticeable abnormality  PSYCH/NEURO: pleasant and cooperative, no obvious depression or anxiety, speech and thought processing grossly  intact  ASSESSMENT AND PLAN:  Discussed the following assessment and plan:  Iritis - Plan: HLA-B27 Antigen, Epstein barr vrs(ebv dna by pcr)  History of Lyme disease  HSV-1 infection -given copy of labs to pick up and take to eye MD 03/18/2019  Disc with pt consider trial of topical antivirals and or oral she will call me back if she wants to try Valtrex   HM  Had flu shot 09/2017  Tdap and hep b titer check with employee health   Declines STD check  Labs done Pap 07/04/18 negative s/p hysterectomy   sch physical for f/u    Consider dermatology in future h/o tanning bed use advised not to do this in future   I discussed the assessment and treatment plan with the patient. The patient was provided an opportunity to ask questions and all were answered. The patient agreed with the plan and demonstrated an understanding of the instructions.   The patient was advised to call back or seek an in-person evaluation if the symptoms worsen or if the condition fails to improve as anticipated.  Time spent 15 minutes  Delorise Jackson, MD

## 2019-03-13 ENCOUNTER — Other Ambulatory Visit (INDEPENDENT_AMBULATORY_CARE_PROVIDER_SITE_OTHER): Payer: No Typology Code available for payment source

## 2019-03-13 DIAGNOSIS — H209 Unspecified iridocyclitis: Secondary | ICD-10-CM | POA: Diagnosis not present

## 2019-03-17 ENCOUNTER — Telehealth: Payer: Self-pay | Admitting: *Deleted

## 2019-03-17 LAB — HLA-B27 ANTIGEN: HLA B27: NEGATIVE

## 2019-03-17 LAB — EPSTEIN BARR VRS(EBV DNA BY PCR): Epstein-Barr DNA Quant, PCR: NEGATIVE copies/mL

## 2019-03-17 NOTE — Telephone Encounter (Signed)
Labs have been faxed.

## 2019-03-17 NOTE — Telephone Encounter (Signed)
Copied from [place] [id number]. Topic: General - Other >> Mar 17, 2019 11:31 AM Nils Flack wrote: Reason for CRM: pt needs to have most recent labs faxed to [facility].  She has appt there tomorrow Thanks

## 2019-04-03 ENCOUNTER — Other Ambulatory Visit: Payer: Self-pay | Admitting: Internal Medicine

## 2019-04-03 ENCOUNTER — Encounter: Payer: Self-pay | Admitting: Internal Medicine

## 2019-04-03 DIAGNOSIS — H209 Unspecified iridocyclitis: Secondary | ICD-10-CM

## 2019-06-29 ENCOUNTER — Other Ambulatory Visit: Payer: Self-pay | Admitting: Internal Medicine

## 2019-06-29 ENCOUNTER — Telehealth: Payer: Self-pay | Admitting: Internal Medicine

## 2019-06-29 DIAGNOSIS — H543 Unqualified visual loss, both eyes: Secondary | ICD-10-CM

## 2019-06-29 NOTE — Telephone Encounter (Signed)
Copied from [place] [id number]. Topic: Referral - Request for Referral >> Jun 29, 2019 11:01 AM Leward Quan A wrote: Has patient seen PCP for this complaint? Yes.   *If NO, is insurance requiring patient see PCP for this issue before PCP can refer them? Referral for which specialty: Opthamology Preferred provider/office: [facility]  Reason for referral: worsened vision  Per patient her insurance is accepted they just need a referral from Dr Olivia Mackie, please advise and inform patient at Ph# [phone]

## 2019-06-29 NOTE — Telephone Encounter (Signed)
[facility] referral asap please

## 2019-08-27 ENCOUNTER — Telehealth: Payer: Self-pay

## 2019-08-27 NOTE — Telephone Encounter (Signed)
Copied from [place] [id number]. Topic: Referral - Status >> Aug 27, 2019 10:36 AM Sheran Luz wrote: Patient calling to make PCP aware that she has seen Rheumatology and Gastroenterology- She states she has to make office aware for it to be covered by insurance.

## 2019-08-28 ENCOUNTER — Other Ambulatory Visit: Payer: Self-pay | Admitting: Internal Medicine

## 2019-08-28 DIAGNOSIS — K921 Melena: Secondary | ICD-10-CM

## 2019-08-28 DIAGNOSIS — H209 Unspecified iridocyclitis: Secondary | ICD-10-CM

## 2019-08-28 NOTE — Telephone Encounter (Signed)
Appears pt saw rheum 10/1 and GI 10/22 with insurance inform should have called before appts now requesting referrals   [place]

## 2019-09-30 ENCOUNTER — Other Ambulatory Visit
Admission: RE | Admit: 2019-09-30 | Discharge: 2019-09-30 | Disposition: A | Discharge status: Home / Self Care | Payer: No Typology Code available for payment source | Source: Ambulatory Visit | Attending: Internal Medicine | Admitting: Internal Medicine

## 2019-09-30 ENCOUNTER — Other Ambulatory Visit: Payer: Self-pay

## 2019-09-30 DIAGNOSIS — Z01812 Encounter for preprocedural laboratory examination: Secondary | ICD-10-CM | POA: Insufficient documentation

## 2019-09-30 DIAGNOSIS — Z20828 Contact with and (suspected) exposure to other viral communicable diseases: Secondary | ICD-10-CM | POA: Diagnosis not present

## 2019-09-30 LAB — SARS CORONAVIRUS 2 (TAT 6-24 HRS): SARS Coronavirus 2: NEGATIVE

## 2019-10-02 ENCOUNTER — Other Ambulatory Visit: Payer: No Typology Code available for payment source

## 2019-10-05 ENCOUNTER — Ambulatory Visit: Payer: No Typology Code available for payment source | Admitting: Anesthesiology

## 2019-10-05 ENCOUNTER — Encounter
Admission: RE | Disposition: A | Discharge status: Home / Self Care | Payer: Self-pay | Source: Home / Self Care | Attending: Internal Medicine

## 2019-10-05 ENCOUNTER — Ambulatory Visit
Admission: RE | Admit: 2019-10-05 | Discharge: 2019-10-05 | Disposition: A | Discharge status: Home / Self Care | Payer: No Typology Code available for payment source | Attending: Internal Medicine | Admitting: Internal Medicine

## 2019-10-05 ENCOUNTER — Other Ambulatory Visit: Payer: Self-pay

## 2019-10-05 ENCOUNTER — Encounter: Payer: Self-pay | Admitting: *Deleted

## 2019-10-05 DIAGNOSIS — D122 Benign neoplasm of ascending colon: Secondary | ICD-10-CM | POA: Diagnosis not present

## 2019-10-05 DIAGNOSIS — Z79899 Other long term (current) drug therapy: Secondary | ICD-10-CM | POA: Diagnosis not present

## 2019-10-05 DIAGNOSIS — Z7951 Long term (current) use of inhaled steroids: Secondary | ICD-10-CM | POA: Insufficient documentation

## 2019-10-05 DIAGNOSIS — H209 Unspecified iridocyclitis: Secondary | ICD-10-CM | POA: Diagnosis not present

## 2019-10-05 DIAGNOSIS — Z800 Family history of malignant neoplasm of digestive organs: Secondary | ICD-10-CM | POA: Diagnosis not present

## 2019-10-05 DIAGNOSIS — K921 Melena: Secondary | ICD-10-CM | POA: Diagnosis not present

## 2019-10-05 HISTORY — PX: COLONOSCOPY WITH PROPOFOL: SHX5780

## 2019-10-05 LAB — HM COLONOSCOPY

## 2019-10-05 SURGERY — COLONOSCOPY WITH PROPOFOL
Anesthesia: General

## 2019-10-05 MED ORDER — PROPOFOL 10 MG/ML IV BOLUS
INTRAVENOUS | Status: DC | PRN
  Administered 2019-10-05: 80 mg via INTRAVENOUS
  Administered 2019-10-05 (×2): 18 mg via INTRAVENOUS

## 2019-10-05 MED ORDER — PROPOFOL 500 MG/50ML IV EMUL
INTRAVENOUS | Status: DC | PRN
  Administered 2019-10-05: 130 ug/kg/min via INTRAVENOUS

## 2019-10-05 MED ORDER — PROPOFOL 500 MG/50ML IV EMUL
INTRAVENOUS | Status: AC
  Filled 2019-10-05: qty 50

## 2019-10-05 MED ORDER — MIDAZOLAM HCL 2 MG/2ML IJ SOLN
INTRAMUSCULAR | Status: AC
  Filled 2019-10-05: qty 2

## 2019-10-05 MED ORDER — LIDOCAINE HCL (CARDIAC) PF 100 MG/5ML IV SOSY
PREFILLED_SYRINGE | INTRAVENOUS | Status: DC | PRN
  Administered 2019-10-05: 50 mg via INTRAVENOUS

## 2019-10-05 MED ORDER — MIDAZOLAM HCL 2 MG/2ML IJ SOLN
INTRAMUSCULAR | Status: DC | PRN
  Administered 2019-10-05: 2 mg via INTRAVENOUS

## 2019-10-05 MED ORDER — SODIUM CHLORIDE 0.9 % IV SOLN
INTRAVENOUS | Status: DC
  Administered 2019-10-05: 09:00:00 via INTRAVENOUS

## 2019-10-05 MED ORDER — LIDOCAINE HCL (PF) 2 % IJ SOLN
INTRAMUSCULAR | Status: AC
  Filled 2019-10-05: qty 10

## 2019-10-05 NOTE — Interval H&P Note (Signed)
History and Physical Interval Note:  10/05/2019 9:14 AM  Stefanie Gomez  has presented today for surgery, with the diagnosis of FHX colon cancer.  The various methods of treatment have been discussed with the patient and family. After consideration of risks, benefits and other options for treatment, the patient has consented to  Procedure(s): COLONOSCOPY WITH PROPOFOL (N/A) as a surgical intervention.  The patient's history has been reviewed, patient examined, no change in status, stable for surgery.  I have reviewed the patient's chart and labs.  Questions were answered to the patient's satisfaction.     [place], [place]

## 2019-10-05 NOTE — Anesthesia Preprocedure Evaluation (Addendum)
Anesthesia Evaluation  Patient identified by MRN, date of birth, ID band Patient awake    Reviewed: Allergy & Precautions, H&P , NPO status , Patient's Chart, lab work & pertinent test results  History of Anesthesia Complications (+) PONV, Family history of anesthesia reaction and history of anesthetic complications (personal hx PONV, and sister with prolonged emergence)  Airway Mallampati: I  TM Distance: >3 FB     Dental  (+) Teeth Intact   Pulmonary neg pulmonary ROS, neg sleep apnea, neg COPD, neg recent URI, Not current smoker,           Cardiovascular (-) anginanegative cardio ROS  (-) dysrhythmias      Neuro/Psych negative neurological ROS  negative psych ROS   GI/Hepatic negative GI ROS, Neg liver ROS,   Endo/Other  negative endocrine ROS  Renal/GU negative Renal ROS   H/o TLH, BSO for cervical adenocarcinoma    Musculoskeletal   Abdominal   Peds  Hematology negative hematology ROS (+)   Anesthesia Other Findings Past Medical History: No date: Adenocarcinoma in situ (AIS) of uterine cervix     Comment:  s/p total hysteretectomy including uterus, cervix               fallopian tubes 05/2017 Dr. Georgianne Fick ovaries intact  No date: Cancer ([place]) No date: Complication of anesthesia No date: Family history of adverse reaction to anesthesia     Comment:  SISTER-HARD TIME WAKING UP No date: HSV-1 (herpes simplex virus 1) infection No date: Medical history non-contributory 09/2016: PONV (postoperative nausea and vomiting)     Comment:  X 1 AFTER BTL  Past Surgical History: No date: ABDOMINAL HYSTERECTOMY     Comment:  05/2017 Dr. Georgianne Fick  No date: APPENDECTOMY     Comment:  2010 03/05/2017: CERVICAL CONE BIOPSY     Comment:  [place], [place] 03/05/2017: CERVICAL CONIZATION W/BX; N/A     Comment:  Procedure: CONIZATION CERVIX WITH BIOPSY;  Surgeon:               Malachy Mood, MD;  Location: [facility];   Service:               Gynecology;  Laterality: N/A; 05/07/2017: CYSTOSCOPY; N/A     Comment:  Procedure: CYSTOSCOPY;  Surgeon: Malachy Mood, MD;               Location: [facility];  Service: Gynecology;  Laterality:               N/A; 05/07/2017: LAPAROSCOPIC HYSTERECTOMY; Bilateral     Comment:  Procedure: HYSTERECTOMY TOTAL LAPAROSCOPIC BILATERAL               SALPINGECTOMY;  Surgeon: Malachy Mood, MD;                Location: [facility];  Service: Gynecology;  Laterality:               Bilateral; 09/25/2016: LAPAROSCOPIC TUBAL LIGATION; Bilateral     Comment:  Procedure: LAPAROSCOPIC TUBAL LIGATION;  Surgeon:               Malachy Mood, MD;  Location: [facility];  Service:               Gynecology;  Laterality: Bilateral; No date: TONSILLECTOMY 09/2016: TUBAL LIGATION No date: WISDOM TOOTH EXTRACTION     Reproductive/Obstetrics negative OB ROS  Anesthesia Physical Anesthesia Plan  ASA: II  Anesthesia Plan: General   Post-op Pain Management:    Induction:   PONV Risk Score and Plan: Propofol infusion and TIVA  Airway Management Planned: Natural Airway and Nasal Cannula  Additional Equipment:   Intra-op Plan:   Post-operative Plan:   Informed Consent: I have reviewed the patients History and Physical, chart, labs and discussed the procedure including the risks, benefits and alternatives for the proposed anesthesia with the patient or authorized representative who has indicated his/her understanding and acceptance.     Dental Advisory Given  Plan Discussed with: Anesthesiologist  Anesthesia Plan Comments:         Anesthesia Quick Evaluation

## 2019-10-05 NOTE — Anesthesia Post-op Follow-up Note (Signed)
Anesthesia QCDR form completed.        

## 2019-10-05 NOTE — Anesthesia Postprocedure Evaluation (Signed)
Anesthesia Post Note  Patient: Stefanie Gomez  Procedure(s) Performed: COLONOSCOPY WITH PROPOFOL (N/A )  Patient location during evaluation: PACU Anesthesia Type: General Level of consciousness: awake and alert Pain management: pain level controlled Vital Signs Assessment: post-procedure vital signs reviewed and stable Respiratory status: spontaneous breathing, nonlabored ventilation and respiratory function stable Cardiovascular status: blood pressure returned to baseline and stable Postop Assessment: no apparent nausea or vomiting Anesthetic complications: no     Last Vitals:  Vitals:   10/05/19 1014 10/05/19 1024  BP: 101/73 106/72  Pulse: 66 (!) 59  Resp: 15 12  Temp:    SpO2: 100% 98%    Last Pain:  Vitals:   10/05/19 1024  TempSrc:   PainSc: 0-No pain                 Durenda Hurt

## 2019-10-05 NOTE — Transfer of Care (Signed)
Immediate Anesthesia Transfer of Care Note  Patient: Stefanie Gomez  Procedure(s) Performed: COLONOSCOPY WITH PROPOFOL (N/A )  Patient Location: PACU and Endoscopy Unit  Anesthesia Type:General  Level of Consciousness: drowsy  Airway & Oxygen Therapy: Patient Spontanous Breathing  Post-op Assessment: Report given to RN and Post -op Vital signs reviewed and stable  Post vital signs: Reviewed and stable  Last Vitals:  Vitals Value Taken Time  BP 90/56 10/05/19 0944  Temp    Pulse 65 10/05/19 0945  Resp 22 10/05/19 0945  SpO2 100 % 10/05/19 0945  Vitals shown include unvalidated device data.  Last Pain:  Vitals:   10/05/19 0820  TempSrc: Temporal  PainSc: 0-No pain         Complications: No apparent anesthesia complications

## 2019-10-05 NOTE — Op Note (Signed)
[facility] Gastroenterology Patient Name: Stefanie Gomez Procedure Date: 10/05/2019 9:02 AM MRN: [id number] Account #: [id number] Date of Birth: 1991-04-23 Admit Type: Outpatient Age: 28 Room: [facility] ENDO ROOM 3 Gender: Female Note Status: Finalized Procedure:             Colonoscopy Indications:           Hematochezia Providers:             Benay Pike. Ellicia Alix MD, MD Medicines:             Propofol per Anesthesia Complications:         No immediate complications. Procedure:             Pre-Anesthesia Assessment:                        - The risks and benefits of the procedure and the                         sedation options and risks were discussed with the                         patient. All questions were answered and informed                         consent was obtained.                        - Patient identification and proposed procedure were                         verified prior to the procedure by the nurse. The                         procedure was verified in the procedure room.                        - ASA Grade Assessment: II - A patient with mild                         systemic disease.                        - After reviewing the risks and benefits, the patient                         was deemed in satisfactory condition to undergo the                         procedure.                        After obtaining informed consent, the colonoscope was                         passed under direct vision. Throughout the procedure,                         the patient's blood pressure, pulse, and oxygen  saturations were monitored continuously. The                         Colonoscope was introduced through the anus and                         advanced to the the terminal ileum, with                         identification of the appendiceal orifice and IC                         valve. The colonoscopy was performed without             difficulty. The patient tolerated the procedure well.                         The quality of the bowel preparation was good. The                         terminal ileum, ileocecal valve, appendiceal orifice,                         and rectum were photographed. Findings:      The perianal and digital rectal examinations were normal. Pertinent       negatives include normal sphincter tone and no palpable rectal lesions.      A 8 mm polyp was found in the ascending colon. The polyp was sessile.       The polyp was removed with a cold snare. Resection and retrieval were       complete.      The terminal ileum appeared normal. Biopsies were taken with a cold       forceps for histology.      Normal mucosa was found in the entire colon. Biopsies for histology were       taken with a cold forceps from the right colon, left colon and rectum       for evaluation of microscopic colitis.      The exam was otherwise without abnormality on direct and retroflexion       views. Impression:            - One 8 mm polyp in the ascending colon, removed with                         a cold snare. Resected and retrieved.                        - The examined portion of the ileum was normal.                         Biopsied.                        - Normal mucosa in the entire examined colon. Biopsied.                        - The examination was otherwise normal on direct and  retroflexion views. Recommendation:        - Patient has a contact number available for                         emergencies. The signs and symptoms of potential                         delayed complications were discussed with the patient.                         Return to normal activities tomorrow. Written                         discharge instructions were provided to the patient.                        - Resume previous diet.                        - Continue present medications.                         - Await pathology results.                        - Repeat colonoscopy is recommended for surveillance.                         The colonoscopy date will be determined after                         pathology results from today's exam become available                         for review.                        - Return to nurse practitioner in 3 months.                        - The findings and recommendations were discussed with                         the patient. Procedure Code(s):     --- Professional ---                        [phone], Colonoscopy, flexible; with removal of                         tumor(s), polyp(s), or other lesion(s) by snare                         technique                        [id number], 70, Colonoscopy, flexible; with biopsy, single                         or multiple Diagnosis Code(s):     --- Professional ---  K92.1, Melena (includes Hematochezia)                        K63.5, Polyp of colon CPT copyright 2019 American Medical Association. All rights reserved. The codes documented in this report are preliminary and upon coder review may  be revised to meet current compliance requirements. Efrain Sella MD, MD 10/05/2019 9:42:54 AM This report has been signed electronically. Number of Addenda: 0 Note Initiated On: 10/05/2019 9:02 AM Scope Withdrawal Time: 0 hours 8 minutes 35 seconds  Total Procedure Duration: 0 hours 17 minutes 21 seconds  Estimated Blood Loss:  Estimated blood loss: none.      [employer]

## 2019-10-05 NOTE — H&P (Signed)
Outpatient short stay form Pre-procedure 10/05/2019 9:12 AM Drelyn Pistilli K. Alice Reichert, M.D.  Primary Physician: Orland Mustard, M.D.  Reason for visit:  Hematochezia, Family history of colon cancer.  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     Current Facility-Administered Medications:  .  0.9 %  sodium chloride infusion, , Intravenous, Continuous, [place], Benay Pike, MD, Last Rate: 20 mL/hr at 10/05/19 [id number]  Medications Prior to Admission  Medication Sig Dispense Refill Last Dose  . loteprednol (LOTEMAX) 0.5 % ophthalmic suspension 1 drop 4 (four) times daily.     . benzonatate (TESSALON) 200 MG capsule Take 1 capsule (200 mg total) by mouth 2 (two) times daily as needed for cough. 20 capsule 0   . fluticasone (FLONASE) 50 MCG/ACT nasal spray Place 2 sprays into both nostrils daily. 16 g 0      Allergies  Allergen Reactions  . Sulfamethoxazole-Trimethoprim Rash     Past Medical History:  Diagnosis Date  . Adenocarcinoma in situ (AIS) of uterine cervix    s/p total hysteretectomy including uterus, cervix fallopian tubes 05/2017 Dr. Georgianne Fick ovaries intact   . Cancer ([place])   . Complication of anesthesia   . Family history of adverse reaction to anesthesia    SISTER-HARD TIME WAKING UP  . HSV-1 (herpes simplex virus 1) infection   . Medical history non-contributory   . PONV (postoperative nausea and vomiting) 09/2016   X 1 AFTER BTL    Review of systems:  Otherwise negative.    Physical Exam  Gen: Alert, oriented. Appears stated age.  HEENT: [place]/AT. PERRLA. Lungs: CTA, no wheezes. CV: RR nl S1, S2. Abd: soft, benign, no masses. BS+ Ext: No edema. Pulses 2+    Planned procedures: Proceed with colonoscopy with biopsies. The patient understands the nature of the planned procedure, indications, risks, alternatives  and potential complications including but not limited to bleeding, infection, perforation, damage to internal organs and possible oversedation/side effects from anesthesia. The patient agrees and gives consent to proceed.  Please refer to procedure notes for findings, recommendations and patient disposition/instructions.     Kaulder Zahner K. Alice Reichert, M.D. Gastroenterology 10/05/2019  9:12 AM

## 2019-10-06 ENCOUNTER — Encounter: Payer: Self-pay | Admitting: Internal Medicine

## 2019-10-06 LAB — SURGICAL PATHOLOGY

## 2019-12-16 ENCOUNTER — Encounter: Payer: Self-pay | Admitting: Internal Medicine

## 2019-12-16 ENCOUNTER — Other Ambulatory Visit: Payer: Self-pay | Admitting: Internal Medicine

## 2019-12-16 NOTE — Addendum Note (Signed)
Addended by: Orland Mustard on: 12/16/2019 12:24 PM   Modules accepted: Orders

## 2020-02-08 ENCOUNTER — Telehealth: Payer: Self-pay | Admitting: Genetic Counselor

## 2020-02-08 NOTE — Telephone Encounter (Signed)
Received a genetic counseling referral from [facility] for fhx of colon cancer. Stefanie Gomez has been cld and scheduled to see Roma Kayser on 4/12 at 10am. Pt aware to arrive 15 minutes early.

## 2020-02-15 ENCOUNTER — Inpatient Hospital Stay: Payer: No Typology Code available for payment source

## 2020-02-15 ENCOUNTER — Encounter: Payer: Self-pay | Admitting: Genetic Counselor

## 2020-02-15 ENCOUNTER — Inpatient Hospital Stay: Payer: No Typology Code available for payment source | Attending: Genetic Counselor | Admitting: Genetic Counselor

## 2020-02-15 ENCOUNTER — Other Ambulatory Visit: Payer: Self-pay

## 2020-02-15 DIAGNOSIS — Z860100 Personal history of colon polyps, unspecified: Secondary | ICD-10-CM | POA: Insufficient documentation

## 2020-02-15 DIAGNOSIS — Z800 Family history of malignant neoplasm of digestive organs: Secondary | ICD-10-CM | POA: Insufficient documentation

## 2020-02-15 DIAGNOSIS — Z8481 Family history of carrier of genetic disease: Secondary | ICD-10-CM | POA: Diagnosis not present

## 2020-02-15 NOTE — Progress Notes (Signed)
REFERRING PROVIDER: Ok Edwards, NP [place],  [place] [address]  PRIMARY PROVIDER:  McLean-Scocuzza, Nino Glow, MD  PRIMARY REASON FOR VISIT:  1. Family history of colorectal cancer   2. Family history of gene mutation   3. Hx of colonic polyp   4. Family history of liver cancer     HISTORY OF PRESENT ILLNESS:   Stefanie Gomez, a 29 y.o. female, was seen for a [place] cancer genetics consultation at the request of Dr. Jacqulyn Liner due to a family history of colorectal cancer and abnormal genetic testing in her father.  Stefanie Gomez presents to clinic today to discuss the possibility of a hereditary predisposition to cancer, genetic testing, and to further clarify her future cancer risks, as well as potential cancer risks for family members.   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 She had a colonoscopy on 10/05/2019 which revealed one 27m tubular adenoma in the ascending colon.    RISK FACTORS:  Menarche was at age 1946or 89  First live birth at age 29  OCP use for approximately 4 years.  Ovaries intact: yes.  Hysterectomy: yes.  Menopausal status: premenopausal.  HRT use: 0 years. Colonoscopy: yes; one tubular adenoma 10/05/19. Mammogram within the last year: n/a. Number of breast biopsies: 0. Up to date with pelvic exams: yes. Any excessive radiation exposure in the past: no   Past Medical History:  Diagnosis Date  . Adenocarcinoma in situ (AIS) of uterine cervix    s/p total hysteretectomy including uterus, cervix fallopian tubes 05/2017 Dr. SGeorgianne Fickovaries intact   . [place]   . Complication of anesthesia   . Family history of adverse reaction to anesthesia    SISTER-HARD TIME WAKING UP  . Family history of colorectal cancer   . Family history of liver cancer   . HSV-1 (herpes simplex virus 1) infection   . Medical history non-contributory   . PONV  (postoperative nausea and vomiting) 09/2016   X 1 AFTER BTL    Past Surgical History:  Procedure Laterality Date  . ABDOMINAL HYSTERECTOMY     05/2017 Dr. SGeorgianne Fick  . APPENDECTOMY     2010  . CERVICAL CONE BIOPSY  03/05/2017   [facility], [facility]  . CERVICAL CONIZATION W/BX N/A 03/05/2017   Procedure: CONIZATION CERVIX WITH BIOPSY;  Surgeon: AMalachy Mood MD;  Location: [facility] ORS;  Service: Gynecology;  Laterality: N/A;  . COLONOSCOPY WITH PROPOFOL N/A 10/05/2019   Procedure: COLONOSCOPY WITH PROPOFOL;  Surgeon: Toledo, TBenay Pike MD;  Location: [facility] ENDOSCOPY;  Service: Gastroenterology;  Laterality: N/A;  . CYSTOSCOPY N/A 05/07/2017   Procedure: CYSTOSCOPY;  Surgeon: SMalachy Mood MD;  Location: [facility] ORS;  Service: Gynecology;  Laterality: N/A;  . LAPAROSCOPIC HYSTERECTOMY Bilateral 05/07/2017   Procedure: HYSTERECTOMY TOTAL LAPAROSCOPIC BILATERAL SALPINGECTOMY;  Surgeon: SMalachy Mood MD;  Location: [facility] ORS;  Service: Gynecology;  Laterality: Bilateral;  . LAPAROSCOPIC TUBAL LIGATION Bilateral 09/25/2016   Procedure: LAPAROSCOPIC TUBAL LIGATION;  Surgeon: AMalachy Mood MD;  Location: [facility] ORS;  Service: Gynecology;  Laterality: Bilateral;  . TONSILLECTOMY    . TUBAL LIGATION  09/2016  . WISDOM TOOTH EXTRACTION      Social History   Socioeconomic History  . Marital status: Married    Spouse name: Not on file  . Number of children: Not on file  . Years of education: Not on file  . Highest  education level: Not on file  Occupational History  . Not on file  Tobacco Use  . Smoking status: Never Smoker  . Smokeless tobacco: Never Used  Substance and Sexual Activity  . Alcohol use: No  . Drug use: No  . Sexual activity: Yes    Birth control/protection: Surgical    Comment: BTL  Other Topics Concern  . Not on file  Social History Narrative   Married 2 kids girls    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[facility] , 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    No drinking smoking    No guns, wears seatbelts     Safe in relationship    Social Determinants of Health   Financial Resource Strain:   . Difficulty of Paying Living Expenses:   Food Insecurity:   . Worried About Charity fundraiser in the Last Year:   . Arboriculturist in the Last Year:   Transportation Needs:   . Film/video editor (Medical):   Marland Kitchen Lack of Transportation (Non-Medical):   Physical Activity:   . Days of Exercise per Week:   . Minutes of Exercise per Session:   Stress:   . Feeling of Stress :   Social Connections:   . Frequency of Communication with Friends and Family:   . Frequency of Social Gatherings with Friends and Family:   . Attends Religious Services:   . Active Member of Clubs or Organizations:   . Attends Archivist Meetings:   Marland Kitchen Marital Status:      FAMILY HISTORY:  We obtained a detailed, 4-generation family history.  Significant diagnoses are listed below: Family History  Problem Relation Age of Onset  . Rectal cancer Father 34       metastasized to lung  . Hyperlipidemia Mother   . Hypertension Mother   . Colon cancer Other        paternal great-uncle  . COPD Maternal Aunt   . Liver cancer Maternal Aunt 61  . Colon cancer Other        paternal great-uncle  . Colon cancer Paternal Great-grandfather   . Colon cancer Other        paternal first cousin once removed   Stefanie Gomez has two daughters, Stefanie Gomez and Stefanie Gomez, who are 65 and 29 years old respectively. She has two sisters - Stefanie Gomez, who is 40 and recently had a normal colonoscopy, and Stefanie Gomez, who is in her 15s and has an upcoming colonoscopy. None of these family members have had cancer.  Stefanie Gomez mother is 45 and has not had cancer. Stefanie Gomez has three maternal aunts and one maternal uncle. One aunt died from liver cancer at the age of 32. Both of her maternal grandparents died when they were older than 74 and did not have cancer. There are no other known diagnoses of cancer on the maternal side of the family.  Stefanie Gomez  father died at the age of 40 from colorectal cancer that had metastasized. He was initially diagnosed at the age of 36 and had abnormal genetic testing, although these records were not available for review. Stefanie Gomez has three paternal aunts and one paternal uncle, none of whom have had cancer. Her paternal grandparents died in their 79s and did not have cancer. Stefanie Gomez great-grandfather (grandfather's father) had colon cancer, and two of her great-uncles (grandfather's brothers) had colon cancer. She does not know what age these family members were diagnosed. She also has a paternal cousin, once removed (grandfather's niece)  who died from colon cancer in her 6s.  Stefanie Gomez is uncertain of previous family history of genetic testing for hereditary cancer risks - she believes her father had abnormal genetic testing for hereditary cancer risks, but is not sure which gene may have had the mutation. Her ancestry is unknown. There is no reported Ashkenazi Jewish ancestry. There is no known consanguinity.  GENETIC COUNSELING ASSESSMENT: Stefanie Gomez is a 29 y.o. female with a family history of colon cancer and an unspecified abnormal genetic test result in her father, which is somewhat suggestive of a hereditary cancer syndrome and predisposition to cancer. We, therefore, discussed and recommended the following at today's visit.   DISCUSSION: We discussed that approximately 5-10% of cancer is hereditary.  Most cases of hereditary colorectal cancer is associated with Lynch syndrome, a genetic condition that increases the risk for colorectal and endometrial cancers, among other cancer types.  There are other genes that can be associated with hereditary colon cancer syndromes.  These include APC (associated with familial adenomatous polyposis), CHEK2, MUTYH, etc.  We discussed that testing is beneficial for several reasons, including knowing about other cancer risks, identifying potential screening and  risk-reduction options that may be appropriate, and to understand if other family members could be at risk for cancer and allow them to undergo genetic testing.   We reviewed the characteristics, features and inheritance patterns of hereditary cancer syndromes. We also discussed genetic testing, including the appropriate family members to test, the process of testing, insurance coverage and turn-around-time for results. We discussed the implications of a negative, positive and/or variant of uncertain significant result. We recommended Stefanie Gomez pursue genetic testing for the Common Hereditary Cancers panel.   The Common Hereditary Cancers Panel offered by [facility] includes sequencing and/or deletion duplication testing of the following 48 genes: APC, ATM, AXIN2, BARD1, BMPR1A, BRCA1, BRCA2, BRIP1, CDH1, CDK4, CDKN2A (p14ARF), CDKN2A (p16INK4a), CHEK2, CTNNA1, DICER1, EPCAM (Deletion/duplication testing only), GREM1 (promoter region deletion/duplication testing only), KIT, MEN1, MLH1, MSH2, MSH3, MSH6, MUTYH, NBN, NF1, NHTL1, PALB2, PDGFRA, PMS2, POLD1, POLE, PTEN, RAD50, RAD51C, RAD51D, RNF43, SDHB, SDHC, SDHD, SMAD4, SMARCA4. STK11, TP53, TSC1, TSC2, and VHL.  The following genes were evaluated for sequence changes only: SDHA and HOXB13 c.251G>A variant only.   Based on Stefanie Gomez family history of a hereditary cancer gene mutation in her father, she meets medical criteria for genetic testing. Despite that she meets criteria, she may still have an out of pocket cost. We discussed that if her out of pocket cost for testing is over $100, the laboratory will call and confirm whether she wants to proceed with testing.  If the out of pocket cost of testing is less than $100 she will be billed by the genetic testing laboratory.   We discussed that some people do not want to undergo genetic testing due to fear of genetic discrimination.  A federal law called the Genetic Information Non-Discrimination Act (GINA)  of 2008 helps protect individuals against genetic discrimination based on their genetic test results.  It impacts both health insurance and employment.  With health insurance, it protects against increased premiums, being kicked off insurance or being forced to take a test in order to be insured.  For employment it protects against hiring, firing and promoting decisions based on genetic test results.  Health status due to a cancer diagnosis is not protected under GINA.  Additionally, life, disability, and long-term care insurance is not protected under GINA.   PLAN: After considering the  risks, benefits, and limitations, Stefanie Gomez provided informed consent to pursue genetic testing and the blood sample was sent to [facility] for analysis of the Common Hereditary Cancers Panel. Results should be available within approximately two-three weeks' time, at which point they will be disclosed by telephone to Stefanie Gomez, as will any additional recommendations warranted by these results. Stefanie Gomez will receive a summary of her genetic counseling visit and a copy of her results once available. This information will also be available in Epic.   Ms. Streed questions were answered to her satisfaction today. Our contact information was provided should additional questions or concerns arise. Thank you for the referral and allowing [place] to share in the care of your patient.   Clint Guy, [place], [facility] Licensed, Certified Dispensing [email or web address] Phone: [phone]  The patient was seen for a total of 35 minutes in face-to-face genetic counseling.  This patient was discussed with Drs. Magrinat, Lindi Adie and/or Burr Medico who agrees with the above.    _______________________________________________________________________ For Office Staff:  Number of people involved in session: 1 Was an Intern/ student involved with case: no

## 2020-02-26 ENCOUNTER — Encounter: Payer: Self-pay | Admitting: Genetic Counselor

## 2020-02-26 ENCOUNTER — Telehealth: Payer: Self-pay | Admitting: Genetic Counselor

## 2020-02-26 ENCOUNTER — Ambulatory Visit: Payer: Self-pay | Admitting: Genetic Counselor

## 2020-02-26 DIAGNOSIS — Z1379 Encounter for other screening for genetic and chromosomal anomalies: Secondary | ICD-10-CM | POA: Insufficient documentation

## 2020-02-26 NOTE — Telephone Encounter (Signed)
LVM that her genetic test results are available and requested that she call back to discuss them.  

## 2020-02-26 NOTE — Telephone Encounter (Signed)
Revealed negative genetic testing.  Discussed that we do not know why there is cancer in the family.  There could be a genetic mutation in the family that Ms. Beitler did not inherit.  There could also be a mutation in a different gene that we are not testing, or our current technology may not be able detect certain mutations.  It will therefore be important for her to stay in contact with genetics to keep up with whether additional testing may be appropriate in the future.   A variant of uncertain significance was detected in the DICER1 gene called c.4888C>T. Her result is still considered normal at this time and should not impact her medical management.

## 2020-02-26 NOTE — Progress Notes (Signed)
HPI:  Ms. Cary was previously seen in the [place] clinic due to a family history of colorectal cancer and concerns regarding a hereditary predisposition to cancer. Please refer to our prior cancer genetics clinic note for more information regarding our discussion, assessment and recommendations, at the time. Ms. Glendinning recent genetic test results were disclosed to her, as were recommendations warranted by these results. These results and recommendations are discussed in more detail below.  FAMILY HISTORY:  We obtained a detailed, 4-generation family history.  Significant diagnoses are listed below: Family History  Problem Relation Age of Onset  . Rectal cancer Father 61       metastasized to lung  . Hyperlipidemia Mother   . Hypertension Mother   . Colon cancer Other        paternal great-uncle  . COPD Maternal Aunt   . Liver cancer Maternal Aunt 61  . Colon cancer Other        paternal great-uncle  . Colon cancer Paternal Great-grandfather   . Colon cancer Other        paternal first cousin once removed   Ms. Bignell has two daughters, Judeth Porch and Fanny Skates, who are 18 and 29 years old respectively. She has two sisters - April, who is 37 and recently had a normal colonoscopy, and Renee, who is in her 66s and has an upcoming colonoscopy. None of these family members have had cancer.  Ms. Splinter mother is 68 and has not had cancer. Ms. Blue has three maternal aunts and one maternal uncle. One aunt died from liver cancer at the age of 92. Both of her maternal grandparents died when they were older than 11 and did not have cancer. There are no other known diagnoses of cancer on the maternal side of the family.  Ms. Liebert father died at the age of 63 from colorectal cancer that had metastasized. He was initially diagnosed at the age of 64 and had abnormal genetic testing on his tumor. Ms. Balch has three paternal aunts and one paternal uncle, none of whom have had  cancer. Her paternal grandparents died in their 61s and did not have cancer. Ms. Stopher great-grandfather (grandfather's father) had colon cancer, and two of her great-uncles (grandfather's brothers) had colon cancer. She does not know what age these family members were diagnosed. She also has a paternal cousin, once removed (grandfather's niece) who died from colon cancer in her 5s.  Ms. Rossa is uncertain of previous family history of genetic testing for hereditary cancer risks. Her ancestry is unknown. There is no reported Ashkenazi Jewish ancestry. There is no known consanguinity.  GENETIC TEST RESULTS: Genetic testing reported out on 02/26/2020 through the [facility] Common Hereditary Cancers panel. No pathogenic variants were detected.   The Common Hereditary Cancers Panel offered by [facility] includes sequencing and/or deletion duplication testing of the following 48 genes: APC, ATM, AXIN2, BARD1, BMPR1A, BRCA1, BRCA2, BRIP1, CDH1, CDK4, CDKN2A (p14ARF), CDKN2A (p16INK4a), CHEK2, CTNNA1, DICER1, EPCAM (Deletion/duplication testing only), GREM1 (promoter region deletion/duplication testing only), KIT, MEN1, MLH1, MSH2, MSH3, MSH6, MUTYH, NBN, NF1, NHTL1, PALB2, PDGFRA, PMS2, POLD1, POLE, PTEN, RAD50, RAD51C, RAD51D, RNF43, SDHB, SDHC, SDHD, SMAD4, SMARCA4. STK11, TP53, TSC1, TSC2, and VHL.  The following genes were evaluated for sequence changes only: SDHA and HOXB13 c.251G>A variant only. The test report will be scanned into EPIC and located under the Molecular Pathology section of the Results Review tab.  A portion of the result report is included below for reference.  We discussed with Ms. Koren that because current genetic testing is not perfect, it is possible there may be a gene mutation in one of these genes that current testing cannot detect, but that chance is small.  We also discussed that there could be another gene that has not yet been discovered, or that we have not yet tested, that  is responsible for the cancer diagnoses in the family. It is also possible there is a hereditary cause for the cancer in the family that Ms. Tison did not inherit and therefore was not identified in her testing.  Therefore, it is important to remain in touch with cancer genetics in the future so that we can continue to offer Ms. Mckendry the most up to date genetic testing.   Genetic testing did identify a variant of uncertain significance (VUS) in the DICER1 gene called c.4888C>T.  At this time, it is unknown if this variant is associated with increased cancer risk or if this is a normal finding, but most variants such as this get reclassified to being inconsequential. It should not be used to make medical management decisions. With time, we suspect the lab will determine the significance of this variant, if any. If we do learn more about it, we will try to contact Ms. Gressman to discuss it further. However, it is important to stay in touch with [place] periodically and keep the address and phone number up to date.  We also discussed that upon review of available records, we were unable to find results indicating that Ms. Wentland father had genetic testing for hereditary cancer risk. It appears that genetic testing was performed on his tumor, which revealed a mutation in the KRAS gene. While KRAS mutations identified in tumor testing may provide insight into treatment options for that cancer, it does not indicate a hereditary risk for colorectal cancer or warrant additional germline testing in the patient or relatives.  CANCER SCREENING RECOMMENDATIONS: Ms. Sawatzky test result is considered negative (normal).  This means that we have not identified a hereditary cause for her family history of cancer at this time. Most cancers happen by chance and this negative result suggests that her family history of cancer may fall into this category.    While reassuring, this does not definitively rule out a hereditary  predisposition to cancer. It is still possible that there could be genetic mutations that are undetectable by current technology. There could be genetic mutations in genes that have not been tested or identified to increase cancer risk.  Therefore, it is recommended she continue to follow the cancer management and screening guidelines provided by her primary healthcare provider.   An individual's cancer risk and medical management are not determined by genetic test results alone. Overall cancer risk assessment incorporates additional factors, including personal medical history, family history, and any available genetic information that may result in a personalized plan for cancer prevention and surveillance.  Per the NCCN Guidelines (Colorectal Cancer Screening Guidelines, Version 2.2021), Ms. Regino should have a colonoscopy every 5 years, or as determined by her GI doctors, beginning at age 75 (or 49 years younger than the earliest diagnosis, whichever is first), based on her family history of colorectal cancer in her father.  RECOMMENDATIONS FOR FAMILY MEMBERS:  Individuals in this family might be at some increased risk of developing cancer, over the general population risk, simply due to the family history of cancer.  We recommended women in this family have a yearly mammogram beginning at age  26, or 10 years younger than the earliest onset of cancer, an annual clinical breast exam, and perform monthly breast self-exams. Women in this family should also have a gynecological exam as recommended by their primary provider.   Per the NCCN Guidelines (Colorectal Cancer Screening Guidelines, Version 2.2021), all first-degree relatives of Ms. Craigie father should have a colonoscopy every 5 years, or as determined by their GI doctors, beginning at age 5 (or 61 years younger than the earliest diagnosis, whichever is first).  FOLLOW-UP: Lastly, we discussed with Ms. Joo that cancer genetics is a rapidly  advancing field and it is possible that new genetic tests will be appropriate for her and/or her family members in the future. We encouraged her to remain in contact with cancer genetics on an annual basis so we can update her personal and family histories and let her know of advances in cancer genetics that may benefit this family.   Our contact number was provided. Ms. Wattley questions were answered to her satisfaction, and she knows she is welcome to call us at anytime with additional questions or concerns.   Clint Guy, [place], [facility] Licensed, Certified Dispensing [email or web address] Phone: [phone]

## 2020-04-11 ENCOUNTER — Telehealth: Payer: Self-pay | Admitting: Genetic Counselor

## 2020-04-11 NOTE — Telephone Encounter (Signed)
Stefanie Gomez called back with questions about billing for her genetic test. Her insurance sent an EOB detailing the cost of testing as $6000. She has not received a bill from the genetic testing laboratory ([facility]) yet. Discussed that since [facility] had not reached out to her about her cost, the out of pocket cost should be $100 or less. Encouraged Stefanie Gomez to call [facility]'s billing department to clarify what her out of pocket cost will be and provided their phone number.

## 2020-04-11 NOTE — Telephone Encounter (Signed)
Returned Ms. Bundrick voicemail regarding questions about her genetic test results. Left contact information and requested that she call back.

## 2020-08-23 ENCOUNTER — Ambulatory Visit (INDEPENDENT_AMBULATORY_CARE_PROVIDER_SITE_OTHER): Payer: No Typology Code available for payment source | Admitting: Nurse Practitioner

## 2020-08-23 ENCOUNTER — Other Ambulatory Visit: Payer: Self-pay | Admitting: Nurse Practitioner

## 2020-08-23 ENCOUNTER — Other Ambulatory Visit: Payer: Self-pay

## 2020-08-23 ENCOUNTER — Encounter: Payer: Self-pay | Admitting: Nurse Practitioner

## 2020-08-23 VITALS — BP 106/70 | HR 88 | Temp 98.2°F | Ht 68.0 in | Wt 156.0 lb

## 2020-08-23 DIAGNOSIS — L02216 Cutaneous abscess of umbilicus: Secondary | ICD-10-CM | POA: Diagnosis not present

## 2020-08-23 DIAGNOSIS — L02211 Cutaneous abscess of abdominal wall: Secondary | ICD-10-CM | POA: Insufficient documentation

## 2020-08-23 MED ORDER — DOXYCYCLINE HYCLATE 100 MG PO TABS: 100.0000 mg | ORAL_TABLET | Freq: Two times a day (BID) | ORAL | 0 refills | Status: DC

## 2020-08-23 NOTE — Patient Instructions (Addendum)
You have a cellulitis with a skin abscess that is just above the bellybutton.  Treatment: Apply warm moist washcloths about 4 times a day. Heat will help get blood flow to the area.  Begin doxycycline 100 mg 1 pill twice daily for 7 days. This is to treat the cellulitis the abscess infection.  Monitor closely for any fevers and chills or chills. If you start to feel ill with fevers or chills or unusual fatigue and the infection may have spread to the bloodstream and you would require IV antibiotics at that time. You should present to the emergency room if you become ill feeling with fevers and chills.  Referral to Dr. Bary Castilla in  general surgery as this abscess may need a procedure called incision and drainage to remove the abscess solid discharge.   Please call back in 48 hours with symptom update. You should notice improvement at that time. Otherwise, see me back on Monday for recheck if not seeing Dr. Bary Castilla next week.    Skin Abscess  A skin abscess is an infected area on or under your skin that contains a collection of pus and other material. An abscess may also be called a furuncle, carbuncle, or boil. An abscess can occur in or on almost any part of your body. Some abscesses break open (rupture) on their own. Most continue to get worse unless they are treated. The infection can spread deeper into the body and eventually into your blood, which can make you feel ill. Treatment usually involves draining the abscess. What are the causes? An abscess occurs when germs, like bacteria, pass through your skin and cause an infection. This may be caused by:  A scrape or cut on your skin.  A puncture wound through your skin, including a needle injection or insect bite.  Blocked oil or sweat glands.  Blocked and infected hair follicles.  A cyst that forms beneath your skin (sebaceous cyst) and becomes infected. What increases the risk? This condition is more likely to develop in people  who:  Have a weak body defense system (immune system).  Have diabetes.  Have dry and irritated skin.  Get frequent injections or use illegal IV drugs.  Have a foreign body in a wound, such as a splinter.  Have problems with their lymph system or veins. What are the signs or symptoms? Symptoms of this condition include:  A painful, firm bump under the skin.  A bump with pus at the top. This may break through the skin and drain. Other symptoms include:  Redness surrounding the abscess site.  Warmth.  Swelling of the lymph nodes (glands) near the abscess.  Tenderness.  A sore on the skin. How is this diagnosed? This condition may be diagnosed based on:  A physical exam.  Your medical history.  A sample of pus. This may be used to find out what is causing the infection.  Blood tests.  Imaging tests, such as an ultrasound, CT scan, or MRI. How is this treated? A small abscess that drains on its own may not need treatment. Treatment for larger abscesses may include:  Moist heat or heat pack applied to the area several times a day.  A procedure to drain the abscess (incision and drainage).  Antibiotic medicines. For a severe abscess, you may first get antibiotics through an IV and then change to antibiotics by mouth. Follow these instructions at home: Medicines   Take over-the-counter and prescription medicines only as told by your health care  provider.  If you were prescribed an antibiotic medicine, take it as told by your health care provider. Do not stop taking the antibiotic even if you start to feel better. Abscess care   If you have an abscess that has not drained, apply heat to the affected area. Use the heat source that your health care provider recommends, such as a moist heat pack or a heating pad. ? Place a towel between your skin and the heat source. ? Leave the heat on for 20-30 minutes. ? Remove the heat if your skin turns bright red. This is  especially important if you are unable to feel pain, heat, or cold. You may have a greater risk of getting burned.  Follow instructions from your health care provider about how to take care of your abscess. Make sure you: ? Cover the abscess with a bandage (dressing). ? Change your dressing or gauze as told by your health care provider. ? Wash your hands with soap and water before you change the dressing or gauze. If soap and water are not available, use hand sanitizer.  Check your abscess every day for signs of a worsening infection. Check for: ? More redness, swelling, or pain. ? More fluid or blood. ? Warmth. ? More pus or a bad smell. General instructions  To avoid spreading the infection: ? Do not share personal care items, towels, or hot tubs with others. ? Avoid making skin contact with other people.  Keep all follow-up visits as told by your health care provider. This is important. Contact a health care provider if you have:  More redness, swelling, or pain around your abscess.  More fluid or blood coming from your abscess.  Warm skin around your abscess.  More pus or a bad smell coming from your abscess.  A fever.  Muscle aches.  Chills or a general ill feeling. Get help right away if you:  Have severe pain.  See red streaks on your skin spreading away from the abscess. Summary  A skin abscess is an infected area on or under your skin that contains a collection of pus and other material.  A small abscess that drains on its own may not need treatment.  Treatment for larger abscesses may include having a procedure to drain the abscess and taking an antibiotic. This information is not intended to replace advice given to you by your health care provider. Make sure you discuss any questions you have with your health care provider. Document Revised: 02/12/2019 Document Reviewed: 12/05/2017 Elsevier Patient Education  2020 [employer].

## 2020-08-23 NOTE — Progress Notes (Signed)
Established Patient Office Visit  Subjective:  Patient ID: Stefanie Gomez, female    DOB: Sep 02, 1991  Age: 29 y.o. MRN: [id number]  CC:  Chief Complaint  Patient presents with  . Acute Visit    hard knot by belly button    HPI Stefanie Gomez presents for a hard knot in her bellybutton onset since "Sunday.  It is red and painful.  Patient had one of her general surgeon colleagues look at it and was told it was likely an abscess.  She doesn't have a history of umbilical ring, but has not had any type of ring and this for over 9 years.  She thinks that it may be her liking waistband might have irritated it.  There has been no injury or trauma to the area.  She has noted some swelling redness and is getting tender.  No fevers or chills.       Past Medical History:  Diagnosis Date  . Adenocarcinoma in situ (AIS) of uterine cervix    s/p total hysteretectomy including uterus, cervix fallopian tubes 05/2017 Dr. Staebler ovaries intact   . Cancer (HCC)   . Complication of anesthesia   . Family history of adverse reaction to anesthesia    SISTER-HARD TIME WAKING UP  . Family history of colorectal cancer   . Family history of liver cancer   . HSV-1 (herpes simplex virus 1) infection   . Medical history non-contributory   . PONV (postoperative nausea and vomiting) 09/2016   X 1 AFTER BTL    Past Surgical History:  Procedure Laterality Date  . ABDOMINAL HYSTERECTOMY     05/2017 Dr. Staebler   . APPENDECTOMY     20" 10  . CERVICAL CONE BIOPSY  03/05/2017   [facility]  . CERVICAL CONIZATION W/BX N/A 03/05/2017   Procedure: CONIZATION CERVIX WITH BIOPSY;  Surgeon: Malachy Mood, MD;  Location: [facility];  Service: Gynecology;  Laterality: N/A;  . COLONOSCOPY WITH PROPOFOL N/A 10/05/2019   Procedure: COLONOSCOPY WITH PROPOFOL;  Surgeon: Toledo, Benay Pike, MD;  Location: [facility];  Service: Gastroenterology;  Laterality: N/A;  . CYSTOSCOPY N/A 05/07/2017   Procedure: CYSTOSCOPY;   Surgeon: Malachy Mood, MD;  Location: [facility];  Service: Gynecology;  Laterality: N/A;  . LAPAROSCOPIC HYSTERECTOMY Bilateral 05/07/2017   Procedure: HYSTERECTOMY TOTAL LAPAROSCOPIC BILATERAL SALPINGECTOMY;  Surgeon: Malachy Mood, MD;  Location: [facility];  Service: Gynecology;  Laterality: Bilateral;  . LAPAROSCOPIC TUBAL LIGATION Bilateral 09/25/2016   Procedure: LAPAROSCOPIC TUBAL LIGATION;  Surgeon: Malachy Mood, MD;  Location: [facility];  Service: Gynecology;  Laterality: Bilateral;  . TONSILLECTOMY    . TUBAL LIGATION  09/2016  . WISDOM TOOTH EXTRACTION      Family History  Problem Relation Age of Onset  . Rectal cancer Father 16       metastasized to lung  . Hyperlipidemia Mother   . Hypertension Mother   . Colon cancer Other        paternal great-uncle  . COPD Maternal Aunt   . Liver cancer Maternal Aunt 61  . Colon cancer Other        paternal great-uncle  . Colon cancer Paternal Great-grandfather   . Colon cancer Other        paternal first cousin once removed    Social History   Socioeconomic History  . Marital status: Married    Spouse name: Not on file  . Number of children: Not on file  . Years of education: Not on file  .  Highest education level: Not on file  Occupational History  . Not on file  Tobacco Use  . Smoking status: Never Smoker  . Smokeless tobacco: Never Used  Vaping Use  . Vaping Use: Never used  Substance and Sexual Activity  . Alcohol use: No  . Drug use: No  . Sexual activity: Yes    Birth control/protection: Surgical    Comment: BTL  Other Topics Concern  . Not on file  Social History Narrative   Married 2 kids girls    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 , 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    No drinking smoking    No guns, wears seatbelts    Safe in relationship    Social Determinants of Health   Financial Resource Strain:   . Difficulty of Paying Living Expenses: Not on file  Food Insecurity:   . Worried About Charity fundraiser in  the Last Year: Not on file  . Ran Out of Food in the Last Year: Not on file  Transportation Needs:   . Lack of Transportation (Medical): Not on file  . Lack of Transportation (Non-Medical): Not on file  Physical Activity:   . Days of Exercise per Week: Not on file  . Minutes of Exercise per Session: Not on file  Stress:   . Feeling of Stress : Not on file  Social Connections:   . Frequency of Communication with Friends and Family: Not on file  . Frequency of Social Gatherings with Friends and Family: Not on file  . Attends Religious Services: Not on file  . Active Member of Clubs or Organizations: Not on file  . Attends Archivist Meetings: Not on file  . Marital Status: Not on file  Intimate Partner Violence:   . Fear of Current or Ex-Partner: Not on file  . Emotionally Abused: Not on file  . Physically Abused: Not on file  . Sexually Abused: Not on file    Outpatient Medications Prior to Visit  Medication Sig Dispense Refill  . LOTEMAX SM 0.38 % GEL     . benzonatate (TESSALON) 200 MG capsule Take 1 capsule (200 mg total) by mouth 2 (two) times daily as needed for cough. 20 capsule 0  . fluticasone (FLONASE) 50 MCG/ACT nasal spray Place 2 sprays into both nostrils daily. 16 g 0  . loteprednol (LOTEMAX) 0.5 % ophthalmic suspension 1 drop 4 (four) times daily.     No facility-administered medications prior to visit.    Allergies  Allergen Reactions  . Sulfamethoxazole-Trimethoprim Rash    Review of Systems  Constitutional: Negative.   HENT: Negative.   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  No active concerns.  Respiratory: Negative.   Cardiovascular: Negative.   Gastrointestinal: Negative for abdominal pain, constipation and nausea.  Endocrine: Negative.   Genitourinary: Negative.   Musculoskeletal: Negative.   Skin: Negative.       Objective:      Physical Exam Vitals reviewed.  Constitutional:      Appearance: Normal appearance.  Cardiovascular:     Rate and Rhythm: Normal rate and regular rhythm.     Pulses: Normal pulses.  Pulmonary:     Effort: Pulmonary effort is normal.  Abdominal:     Palpations: Abdomen is soft.     Comments: Superior to the umbilicus is a bed of erythema, there is swelling, and about  an inch of indurated tissue. This is an abscess with the mild surrounding cellulitis. Surrounding area abdomen is soft non tender. The umbilicus itself is uninvolved.  She does have scar from piercing but the abscess is just superior to that. There is no oozing or any drainage from the abscess or the piercing site.  Musculoskeletal:     Cervical back: Normal range of motion and neck supple.  Skin:    General: Skin is warm and dry.  Neurological:     General: No focal deficit present.     Mental Status: She is alert and oriented to person, place, and time.  Psychiatric:        Mood and Affect: Mood normal.        Behavior: Behavior normal.     BP 106/70 (BP Location: Left Arm, Patient Position: Sitting, Cuff Size: Normal)   Pulse 88   Temp 98.2 F (36.8 C) (Oral)   Ht 5\' 8"  (1.727 m)   Wt 156 lb (70.8 kg)   LMP 02/06/2017 (Approximate)   SpO2 98%   BMI 23.72 kg/m  Wt Readings from Last 3 Encounters:  08/23/20 156 lb (70.8 kg)  10/05/19 160 lb (72.6 kg)  12/17/18 158 lb (71.7 kg)   Pulse Readings from Last 3 Encounters:  08/23/20 88  10/05/19 (!) 59  12/17/18 63    BP Readings from Last 3 Encounters:  08/23/20 106/70  10/05/19 106/72  12/17/18 100/82    Lab Results  Component Value Date   CHOL 154 03/05/2019   HDL 63.30 03/05/2019   LDLCALC 80 03/05/2019   TRIG 50.0 03/05/2019   CHOLHDL 2 03/05/2019      Health Maintenance Due  Topic Date Due  . Hepatitis C Screening  Never done    There are no preventive care reminders to display for this patient.  Lab Results  Component Value Date    TSH 2.09 03/05/2019   Lab Results  Component Value Date   WBC 5.2 03/05/2019   HGB 14.8 03/05/2019   HCT 43.9 03/05/2019   MCV 89.6 03/05/2019   PLT 185.0 03/05/2019   Lab Results  Component Value Date   NA 140 03/05/2019   K 3.8 03/05/2019   CO2 26 03/05/2019   GLUCOSE 79 03/05/2019   BUN 12 03/05/2019   CREATININE 0.80 03/05/2019   BILITOT 1.6 (H) 03/05/2019   ALKPHOS 54 03/05/2019   AST 15 03/05/2019   ALT 11 03/05/2019   PROT 7.4 03/05/2019   ALBUMIN 4.8 03/05/2019   CALCIUM 9.7 03/05/2019   ANIONGAP 4 (L) 05/08/2017   GFR 85.39 03/05/2019   Lab Results  Component Value Date   CHOL 154 03/05/2019   Lab Results  Component Value Date   HDL 63.30 03/05/2019   Lab Results  Component Value Date   LDLCALC 80 03/05/2019   Lab Results  Component Value Date   TRIG 50.0 03/05/2019   Lab Results  Component Value Date   CHOLHDL 2 03/05/2019   No results found for: HGBA1C    Assessment & Plan:   Problem List Items Addressed This Visit      Musculoskeletal and Integument   Cutaneous abscess of umbilicus - Primary   Relevant Orders   Ambulatory referral to General Surgery      Meds ordered this encounter  Medications  . doxycycline (VIBRA-TABS) 100 MG tablet    Sig: Take 1 tablet (100 mg total) by mouth 2 (two) times daily for 7 days.  Dispense:  14 tablet    Refill:  0    Order Specific Question:   Supervising Provider    Answer:   Einar Pheasant [[id number]]   You have a cellulitis with a skin abscess that is just above the bellybutton.  Treatment: Apply warm moist washcloths about 4 times a day. Heat will help get blood flow to the area.  Begin doxycycline 100 mg 1 pill twice daily for 7 days. This is to treat the cellulitis and the abscess infection.Eat Activia yogurt for probiotics. Cautioned about sunburn.   Monitor closely for any fevers and chills or chills. If you start to feel ill with fevers or chills or unusual fatigue and the infection may  have spread to the bloodstream and you would require IV antibiotics at that time. You should present to the emergency room if you become ill feeling with fevers and chills.  Referral to Dr. Bary Castilla in  general surgery as this abscess may need a procedure called incision and drainage to remove the abscess solid discharge.   Please call back in 48 hours with symptom update. You should notice improvement at that time. Otherwise, see me back on Monday for recheck if not seeing Dr. Bary Castilla next week.   Follow-up: Return in about 1 week (around 08/30/2020).   This visit occurred during the SARS-CoV-2 public health emergency.  Safety protocols were in place, including screening questions prior to the visit, additional usage of staff PPE, and extensive cleaning of exam room while observing appropriate contact time as indicated for disinfecting solutions.   Denice Paradise, NP

## 2020-08-25 ENCOUNTER — Telehealth: Payer: Self-pay | Admitting: Internal Medicine

## 2020-08-25 NOTE — Telephone Encounter (Signed)
Pt called and said that she saw Dr. Bary Castilla today and wanted to cancel her appt for 10/25

## 2020-08-25 NOTE — Telephone Encounter (Signed)
I spoke to her today and she is doing well. He told her to take the Doxy. She is eating Acitivia as recommended. She plans to f/up with Dr. Bary Castilla for abscess healing after he did the I& D today. She will call our office if needed. She may CXL her f/up appt.

## 2020-08-29 ENCOUNTER — Ambulatory Visit: Payer: No Typology Code available for payment source | Admitting: Nurse Practitioner

## 2020-09-19 ENCOUNTER — Encounter: Payer: Self-pay | Admitting: Internal Medicine

## 2020-12-27 ENCOUNTER — Ambulatory Visit: Payer: No Typology Code available for payment source | Admitting: Internal Medicine

## 2020-12-27 ENCOUNTER — Ambulatory Visit (INDEPENDENT_AMBULATORY_CARE_PROVIDER_SITE_OTHER): Payer: No Typology Code available for payment source | Admitting: Internal Medicine

## 2020-12-27 ENCOUNTER — Other Ambulatory Visit: Payer: Self-pay

## 2020-12-27 ENCOUNTER — Encounter: Payer: Self-pay | Admitting: Internal Medicine

## 2020-12-27 VITALS — BP 106/78 | HR 74 | Temp 97.6°F | Ht 68.0 in | Wt 156.0 lb

## 2020-12-27 DIAGNOSIS — R768 Other specified abnormal immunological findings in serum: Secondary | ICD-10-CM

## 2020-12-27 DIAGNOSIS — Z13818 Encounter for screening for other digestive system disorders: Secondary | ICD-10-CM | POA: Diagnosis not present

## 2020-12-27 DIAGNOSIS — Z0000 Encounter for general adult medical examination without abnormal findings: Secondary | ICD-10-CM

## 2020-12-27 DIAGNOSIS — Z13220 Encounter for screening for lipoid disorders: Secondary | ICD-10-CM

## 2020-12-27 DIAGNOSIS — R17 Unspecified jaundice: Secondary | ICD-10-CM

## 2020-12-27 DIAGNOSIS — H2013 Chronic iridocyclitis, bilateral: Secondary | ICD-10-CM

## 2020-12-27 DIAGNOSIS — R748 Abnormal levels of other serum enzymes: Secondary | ICD-10-CM

## 2020-12-27 DIAGNOSIS — Z1389 Encounter for screening for other disorder: Secondary | ICD-10-CM

## 2020-12-27 DIAGNOSIS — Z1329 Encounter for screening for other suspected endocrine disorder: Secondary | ICD-10-CM

## 2020-12-27 NOTE — Patient Instructions (Signed)
Uveitis Uveitis is inflammation of the eye. It often affects the middle part of the eye (uvea). This area contains many of the blood vessels that supply the rest of the eye. The uvea is made up of three structures:  The middle layer of the eyeball (choroid).  The colored part of the front of the eye (iris).  The connection between the iris and the choroid (ciliary body). Uveitis can affect any part of the uvea as well as other important structures in the eye. There are many types of uveitis:  Iritis, or anterior uveitis, affects the iris. This is the most common type. It can start suddenly and can last for many weeks.  Intermediate uveitis affects the structures in the middle of the eye. This includes the fluid that fills the eye (vitreous). This type can last for years and can come and go.  Posterior uveitis affects the structures in the back of the eye. This includes the light-sensitive layer of cells that is needed for vision (retina). This is the least common type.  Panuveitis affects all layers of the eye. Uveitis can affect one eye or both eyes. It can cause short-term or long-term symptoms. Symptoms may go away and come back. Over time, the condition can damage or destroy eye structures and may lead to vision loss. What are the causes? This condition may be caused by:  Autoimmune diseases. These are diseases in which the body's defense system (immune system) mistakenly attacks the body's own tissues.  Infections that start in the eye or spread to the eye.  Inflammatory diseases that can affect the eye.  Eye injuries. In some cases, the cause may not be known. What increases the risk? The following factors may make you more likely to develop this condition:  Being 64-2 years old.  Using tobacco products, such as cigarettes.  Taking certain medicines.  Having certain inherited genes. What are the signs or symptoms? Symptoms of this condition depend on the type of  uveitis. Common symptoms include:  Eye redness.  Eye pain.  Blurred vision.  Decreased vision.  Having a blind spot in your vision.  Floating dark spots in your vision (floaters).  Seeing flashing lights (photopsia).  Sensitivity to light (photophobia).  A white section on the lower part of the iris (hypopyon). How is this diagnosed? This condition may be diagnosed based on:  An eye exam.  A vision test using eye charts.  An exam in which a scope is used to view inside the eye (ophthalmoscope or slit lamp). Eye drops may be used to widen (dilate) your pupil to make it easier to see inside your eye.  A test to measure eye pressure. You may have other medical tests to help determine the cause of your uveitis. How is this treated? Treatment for this condition may depend on the type of uveitis that you have. It should be started right away to help prevent vision loss. Treatment may include:  Medicines to treat inflammation (steroids). You may get steroids: ? As eye drops. ? As an injection into your eye. ? By mouth.  Eye drops to dilate the pupil and reduce pressure inside the eye.  In some cases, medicines may be given through an IV or through a device that is implanted inside the eye.  Other medicines may be needed depending on the cause of your uveitis.  In rare cases, surgery may be needed (vitrectomy). Follow these instructions at home:  Take over-the-counter and prescription medicines only as  told by your health care provider.  Follow instructions for safely putting eye drops in your eyes.  Drink enough fluid to keep your urine pale yellow.  Follow instructions from your health care provider about any restrictions on your activities. Ask what activities are safe for you.  Do not use any products that contain nicotine or tobacco, such as cigarettes and e-cigarettes. If you need help quitting, ask your health care provider.  Keep all follow-up visits as told  by your health care provider. This is important.   Contact a health care provider if:  Your symptoms do not improve. Get help right away if:  You have vision loss in either eye.  You have more redness in one eye or both eyes.  Your eyes are very sensitive to light.  You have pain or aching in either eye. Summary  Uveitis is inflammation of the eye. It often affects the middle part of the eye (uvea). This area contains many of the blood vessels that supply the rest of the eye.  Uveitis can affect one eye or both eyes. It can cause short-term or long-term symptoms. Symptoms may go away and come back.  Treatment for this condition may depend on the type of uveitis that you have. It should be started right away to help prevent vision loss.  Over time, the condition can damage or destroy eye structures and may lead to vision loss. This information is not intended to replace advice given to you by your health care provider. Make sure you discuss any questions you have with your health care provider. Document Revised: 11/12/2017 Document Reviewed: 11/12/2017 Elsevier Patient Education  2021 [employer].  Methotrexate tablets What is this medicine? METHOTREXATE (METH oh TREX ate) is a chemotherapy drug used to treat cancer including breast cancer, leukemia, and lymphoma. This medicine can also be used to treat psoriasis and certain kinds of arthritis. This medicine may be used for other purposes; ask your health care provider or pharmacist if you have questions. COMMON BRAND NAME(S): Rheumatrex, Trexall What should I tell my health care provider before I take this medicine? They need to know if you have any of these conditions:  fluid in the stomach area or lungs  if you often drink alcohol  infection or immune system problems  kidney disease or on hemodialysis  liver disease  low blood counts, like low white cell, platelet, or red cell counts  lung disease  radiation  therapy  stomach ulcers  ulcerative colitis  an unusual or allergic reaction to methotrexate, other medicines, foods, dyes, or preservatives  pregnant or trying to get pregnant  breast-feeding How should I use this medicine? Take this medicine by mouth with a glass of water. Follow the directions on the prescription label. Take your medicine at regular intervals. Do not take it more often than directed. Do not stop taking except on your doctor's advice. Make sure you know why you are taking this medicine and how often you should take it. If this medicine is used for a condition that is not cancer, like arthritis or psoriasis, it should be taken weekly, NOT daily. Taking this medicine more often than directed can cause serious side effects, even death. Talk to your healthcare provider about safe handling and disposal of this medicine. You may need to take special precautions. Talk to your pediatrician regarding the use of this medicine in children. While this drug may be prescribed for selected conditions, precautions do apply. Overdosage: If you think  you have taken too much of this medicine contact a poison control center or emergency room at once. NOTE: This medicine is only for you. Do not share this medicine with others. What if I miss a dose? If you miss a dose, talk with your doctor or health care professional. Do not take double or extra doses. What may interact with this medicine? Do not take this medicine with any of the following medications:  acitretin This medicine may also interact with the following medication:  aspirin and aspirin-like medicines including salicylates  azathioprine  certain antibiotics like penicillins, tetracycline, and chloramphenicol  certain medicines that treat or prevent blood clots like warfarin, apixaban, dabigatran, and rivaroxaban  certain medicines for stomach problems like esomeprazole, omeprazole,  pantoprazole  cyclosporine  dapsone  diuretics  gold  hydroxychloroquine  live virus vaccines  medicines for infection like acyclovir, adefovir, amphotericin B, bacitracin, cidofovir, foscarnet, ganciclovir, gentamicin, pentamidine, vancomycin  mercaptopurine  NSAIDs, medicines for pain and inflammation, like ibuprofen or naproxen  other cytotoxic agents  pamidronate  pemetrexed  penicillamine  phenylbutazone  phenytoin  probenecid  pyrimethamine  retinoids such as isotretinoin and tretinoin  steroid medicines like prednisone or cortisone  sulfonamides like sulfasalazine and trimethoprim/sulfamethoxazole  theophylline  zoledronic acid This list may not describe all possible interactions. Give your health care provider a list of all the medicines, herbs, non-prescription drugs, or dietary supplements you use. Also tell them if you smoke, drink alcohol, or use illegal drugs. Some items may interact with your medicine. What should I watch for while using this medicine? Avoid alcoholic drinks. This medicine can make you more sensitive to the sun. Keep out of the sun. If you cannot avoid being in the sun, wear protective clothing and use sunscreen. Do not use sun lamps or tanning beds/booths. You may need blood work done while you are taking this medicine. Call your doctor or health care professional for advice if you get a fever, chills or sore throat, or other symptoms of a cold or flu. Do not treat yourself. This drug decreases your body's ability to fight infections. Try to avoid being around people who are sick. This medicine may increase your risk to bruise or bleed. Call your doctor or health care professional if you notice any unusual bleeding. Be careful brushing or flossing your teeth or using a toothpick because you may get an infection or bleed more easily. If you have any dental work done, tell your dentist you are receiving this medicine. Check with your  doctor or health care professional if you get an attack of severe diarrhea, nausea and vomiting, or if you sweat a lot. The loss of too much body fluid can make it dangerous for you to take this medicine. Talk to your doctor about your risk of cancer. You may be more at risk for certain types of cancers if you take this medicine. Do not become pregnant while taking this medicine or for 6 months after stopping it. Women should inform their health care provider if they wish to become pregnant or think they might be pregnant. Men should not father a child while taking this medicine and for 3 months after stopping it. There is potential for serious harm to an unborn child. Talk to your health care provider for more information. Do not breast-feed an infant while taking this medicine or for 1 week after stopping it. This medicine may make it more difficult to get pregnant or father a child. Talk to your  health care provider if you are concerned about your fertility. What side effects may I notice from receiving this medicine? Side effects that you should report to your doctor or health care professional as soon as possible:  allergic reactions like skin rash, itching or hives, swelling of the face, lips, or tongue  breathing problems or shortness of breath  diarrhea  dry, nonproductive cough  low blood counts - this medicine may decrease the number of white blood cells, red blood cells and platelets. You may be at increased risk for infections and bleeding.  mouth sores  redness, blistering, peeling or loosening of the skin, including inside the mouth  signs of infection - fever or chills, cough, sore throat, pain or trouble passing urine  signs and symptoms of bleeding such as bloody or black, tarry stools; red or dark-brown urine; spitting up blood or brown material that looks like coffee grounds; red spots on the skin; unusual bruising or bleeding from the eye, gums, or nose  signs and  symptoms of kidney injury like trouble passing urine or change in the amount of urine  signs and symptoms of liver injury like dark yellow or brown urine; general ill feeling or flu-like symptoms; light-colored stools; loss of appetite; nausea; right upper belly pain; unusually weak or tired; yellowing of the eyes or skin Side effects that usually do not require medical attention (report to your doctor or health care professional if they continue or are bothersome):  dizziness  hair loss  tiredness  upset stomach  vomiting This list may not describe all possible side effects. Call your doctor for medical advice about side effects. You may report side effects to FDA at 1-800-FDA-1088. Where should I keep my medicine? Keep out of the reach of children and pets. Store at room temperature between 20 and 25 degrees C (68 and 77 degrees F). Protect from light. Get rid of any unused medicine after the expiration date. Talk to your health care provider about how to dispose of unused medicine. Special directions may apply. NOTE: This sheet is a summary. It may not cover all possible information. If you have questions about this medicine, talk to your doctor, pharmacist, or health care provider.  2021 Elsevier/Gold Standard (2020-05-23 10:40:39)   Azathioprine tablets What is this medicine? AZATHIOPRINE (ay za THYE oh preen) suppresses the immune system. It is used to prevent organ rejection after a transplant. It is also used to treat rheumatoid arthritis. This medicine may be used for other purposes; ask your health care provider or pharmacist if you have questions. COMMON BRAND NAME(S): Azasan, Imuran What should I tell my health care provider before I take this medicine? They need to know if you have any of these conditions:  infection  kidney disease  liver disease  an unusual or allergic reaction to azathioprine, other medicines, lactose, foods, dyes, or preservatives  pregnant or  trying to get pregnant  breast feeding How should I use this medicine? Take this medicine by mouth with a full glass of water. Follow the directions on the prescription label. Take your medicine at regular intervals. Do not take your medicine more often than directed. Continue to take your medicine even if you feel better. Do not stop taking except on your doctor's advice. Talk to your pediatrician regarding the use of this medicine in children. Special care may be needed. Overdosage: If you think you have taken too much of this medicine contact a poison control center or emergency room at  once. NOTE: This medicine is only for you. Do not share this medicine with others. What if I miss a dose? If you miss a dose, take it as soon as you can. If it is almost time for your next dose, take only that dose. Do not take double or extra doses. What may interact with this medicine? Do not take this medicine with any of the following medications:  febuxostat  mercaptopurine This medicine may also interact with the following medications:  allopurinol  aminosalicylates like sulfasalazine, mesalamine, balsalazide, and olsalazine  leflunomide  medicines called ACE inhibitors like benazepril, captopril, enalapril, fosinopril, quinapril, lisinopril, ramipril, and trandolapril  mycophenolate  sulfamethoxazole; trimethoprim  vaccines  warfarin This list may not describe all possible interactions. Give your health care provider a list of all the medicines, herbs, non-prescription drugs, or dietary supplements you use. Also tell them if you smoke, drink alcohol, or use illegal drugs. Some items may interact with your medicine. What should I watch for while using this medicine? Visit your doctor or health care professional for regular checks on your progress. You will need frequent blood checks during the first few months you are receiving the medicine. If you get a cold or other infection while  receiving this medicine, call your doctor or health care professional. Do not treat yourself. The medicine may increase your risk of getting an infection. Women should inform their doctor if they wish to become pregnant or think they might be pregnant. There is a potential for serious side effects to an unborn child. Talk to your health care professional or pharmacist for more information. Men may have a reduced sperm count while they are taking this medicine. Talk to your health care professional for more information. This medicine may increase your risk of getting certain kinds of cancer. Talk to your doctor about healthy lifestyle choices, important screenings, and your risk. What side effects may I notice from receiving this medicine? Side effects that you should report to your doctor or health care professional as soon as possible:  allergic reactions like skin rash, itching or hives, swelling of the face, lips, or tongue  changes in vision  confusion  fever, chills, or any other sign of infection  loss of balance or coordination  severe stomach pain  unusual bleeding, bruising  unusually weak or tired  vomiting  yellowing of the eyes or skin Side effects that usually do not require medical attention (report to your doctor or health care professional if they continue or are bothersome):  hair loss  nausea This list may not describe all possible side effects. Call your doctor for medical advice about side effects. You may report side effects to FDA at 1-800-FDA-1088. Where should I keep my medicine? Keep out of the reach of children. Store at room temperature between 15 and 25 degrees C (59 and 77 degrees F). Protect from light. Throw away any unused medicine after the expiration date. NOTE: This sheet is a summary. It may not cover all possible information. If you have questions about this medicine, talk to your doctor, pharmacist, or health care provider.  2021  Elsevier/Gold Standard (2014-02-16 12:00:31)   Adalimumab Injection What is this medicine? ADALIMUMAB (ay da LIM yoo mab) is used to treat rheumatoid and psoriatic arthritis. It is also used to treat ankylosing spondylitis, Crohn's disease, ulcerative colitis, plaque psoriasis, hidradenitis suppurativa, and uveitis. This medicine may be used for other purposes; ask your health care provider or pharmacist if you have questions.  COMMON BRAND NAME(S): CYLTEZO, Humira What should I tell my health care provider before I take this medicine? They need to know if you have any of these conditions:  cancer  diabetes (high blood sugar)  having surgery  heart disease  hepatitis B  immune system problems  infections, such as tuberculosis (TB) or other bacterial, fungal, or viral infections  multiple sclerosis  recent or upcoming vaccine  an unusual reaction to adalimumab, mannitol, latex, rubber, other medicines, foods, dyes, or preservatives  pregnant or trying to get pregnant  breast-feeding How should I use this medicine? This medicine is for injection under the skin. You will be taught how to prepare and give it. Take it as directed on the prescription label. Keep taking it unless your health care provider tells you to stop. It is important that you put your used needles and syringes in a special sharps container. Do not put them in a trash can. If you do not have a sharps container, call your pharmacist or health care provider to get one. This medicine comes with INSTRUCTIONS FOR USE. Ask your pharmacist for directions on how to use this medicine. Read the information carefully. Talk to your pharmacist or health care provider if you have questions. A special MedGuide will be given to you by the pharmacist with each prescription and refill. Be sure to read this information carefully each time. Talk to your pediatrician regarding the use of this medicine in children. While this drug may  be prescribed for children as young as 2 years for selected conditions, precautions do apply. Overdosage: If you think you have taken too much of this medicine contact a poison control center or emergency room at once. NOTE: This medicine is only for you. Do not share this medicine with others. What if I miss a dose? If you miss a dose, take it as soon as you can. If it is almost time for your next dose, take only that dose. Do not take double or extra doses. It is important not to miss any doses. Talk to your health care provider about what to do if you miss a dose. What may interact with this medicine? Do not take this medicine with any of the following medications:  abatacept  anakinra  biologic medicines such as certolizumab, etanercept, golimumab, infliximab  live virus vaccines This medicine may also interact with the following medications:  cyclosporine  theophylline  vaccines  warfarin This list may not describe all possible interactions. Give your health care provider a list of all the medicines, herbs, non-prescription drugs, or dietary supplements you use. Also tell them if you smoke, drink alcohol, or use illegal drugs. Some items may interact with your medicine. What should I watch for while using this medicine? Visit your health care provider for regular checks on your progress. Tell your health care provider if your symptoms do not start to get better or if they get worse. You will be tested for tuberculosis (TB) before you start this medicine. If your doctor prescribes any medicine for TB, you should start taking the TB medicine before starting this medicine. Make sure to finish the full course of TB medicine. This medicine may increase your risk of getting an infection. Call your health care provider for advice if you get a fever, chills, sore throat, or other symptoms of a cold or flu. Do not treat yourself. Try to avoid being around people who are sick. Talk to your  health care provider about your  risk of cancer. You may be more at risk for certain types of cancer if you take this medicine. What side effects may I notice from receiving this medicine? Side effects that you should report to your doctor or health care professional as soon as possible:  allergic reactions like skin rash, itching or hives, swelling of the face, lips, or tongue  changes in vision  chest pain  dizziness  heart failure (trouble breathing; fast, irregular heartbeat; sudden weight gain; swelling of the ankles, feet, hands; unusually weak or tired)  infection (fever, chills, cough, sore throat, pain or trouble passing urine)  liver injury (dark yellow or brown urine; general ill feeling or flu-like symptoms; loss of appetite, right upper belly pain; unusually weak or tired, yellowing of the eyes or skin)  lump or swollen lymph nodes on the neck, groin, or underarm area  muscle weakness  pain, tingling, numbness in the hands or feet  red, scaly patches or raised bumps on the skin  trouble breathing  unusual bleeding or bruising  unusually weak or tired Side effects that usually do not require medical attention (report to your doctor or health care professional if they continue or are bothersome):  headache  nausea  pain, redness, or irritation at site where injected  stuffy or runny nose This list may not describe all possible side effects. Call your doctor for medical advice about side effects. You may report side effects to FDA at 1-800-FDA-1088. Where should I keep my medicine? Keep out of the reach of children and pets. Store in the refrigerator between 2 and 8 degrees C (36 and 46 degrees F). Do not freeze. Keep this medicine in the original packaging until you are ready to take it. Protect from light. Get rid of any unused medicine after the expiration date. This medicine may be stored at room temperature for up to 14 days. Keep this medicine in the  original packaging. Protect from light. If it is stored at room temperature, get rid of any unused medicine after 14 days or after it expires, whichever is first. To get rid of medicines that are no longer needed or have expired:  Take the medicine to a medicine take-back program. Check with your pharmacy or law enforcement to find a location.  If you cannot return the medicine, ask your pharmacist or health care provider how to get rid of this medicine safely. NOTE: This sheet is a summary. It may not cover all possible information. If you have questions about this medicine, talk to your doctor, pharmacist, or health care provider.  2021 Elsevier/Gold Standard (2019-12-31 17:28:40)

## 2020-12-27 NOTE — Progress Notes (Signed)
Chief Complaint  Patient presents with  . Referral   Annual  1. Chronic uveitis R>L but both eyes seeing Dr Posey Pronto rheumatology [facility] who disc mtx vs humira and considering humira to get approved we disc tx options as she has tried steroid drops prednisolone made vision blurry, oral steroids did not like the way I tmade her feel and he disc mtx and humira I also mention azathioprine as a tx option consider oral mtx 1st  She has titers + lyme and hsv 1+ no h/o cat exposure for toxoplasmosis but still is uncertain as to why has uveitis flares and wants to know the cause and wants referral to Dr. Rutherford Nail [facility] in [place] who specializes in this condition  She on Lotemax dm 0.38% gel and prednisolone eye drops   Review of Systems  Constitutional: Negative for weight loss.  HENT: Negative for hearing loss.   Eyes: Positive for redness. Negative for blurred vision.  Respiratory: Negative for shortness of breath.   Cardiovascular: Negative for chest pain.  Gastrointestinal: Negative for abdominal pain.  Musculoskeletal: Positive for back pain and joint pain.       At times back and shoulder pain    Skin: Negative for rash.  Neurological: Negative for headaches.  Psychiatric/Behavioral: Negative for depression.   Past Medical History:  Diagnosis Date  . Adenocarcinoma in situ (AIS) of uterine cervix    s/p total hysteretectomy including uterus, cervix fallopian tubes 05/2017 Dr. Georgianne Fick ovaries intact   . Cancer ([place])   . Complication of anesthesia   . COVID-19    07/2020  . Family history of adverse reaction to anesthesia    SISTER-HARD TIME WAKING UP  . Family history of colorectal cancer   . Family history of liver cancer   . HSV-1 (herpes simplex virus 1) infection   . Medical history non-contributory   . PONV (postoperative nausea and vomiting) 09/2016   X 1 AFTER BTL  . Positive Lyme disease serology    Past Surgical History:  Procedure Laterality Date  . ABDOMINAL HYSTERECTOMY      05/2017 Dr. Georgianne Fick   . APPENDECTOMY     2010  . CERVICAL CONE BIOPSY  03/05/2017   [facility]  . CERVICAL CONIZATION W/BX N/A 03/05/2017   Procedure: CONIZATION CERVIX WITH BIOPSY;  Surgeon: Malachy Mood, MD;  Location: [facility] ORS;  Service: Gynecology;  Laterality: N/A;  . COLONOSCOPY WITH PROPOFOL N/A 10/05/2019   Procedure: COLONOSCOPY WITH PROPOFOL;  Surgeon: Toledo, Benay Pike, MD;  Location: [facility] ENDOSCOPY;  Service: Gastroenterology;  Laterality: N/A;  . CYSTOSCOPY N/A 05/07/2017   Procedure: CYSTOSCOPY;  Surgeon: Malachy Mood, MD;  Location: [facility] ORS;  Service: Gynecology;  Laterality: N/A;  . LAPAROSCOPIC HYSTERECTOMY Bilateral 05/07/2017   Procedure: HYSTERECTOMY TOTAL LAPAROSCOPIC BILATERAL SALPINGECTOMY;  Surgeon: Malachy Mood, MD;  Location: [facility] ORS;  Service: Gynecology;  Laterality: Bilateral;  . LAPAROSCOPIC TUBAL LIGATION Bilateral 09/25/2016   Procedure: LAPAROSCOPIC TUBAL LIGATION;  Surgeon: Malachy Mood, MD;  Location: [facility] ORS;  Service: Gynecology;  Laterality: Bilateral;  . TONSILLECTOMY    . TUBAL LIGATION  09/2016  . WISDOM TOOTH EXTRACTION     Family History  Problem Relation Age of Onset  . Rectal cancer Father 30       metastasized to lung  . Pulmonary embolism Father   . Hyperlipidemia Mother   . Hypertension Mother   . Breast cancer Mother 59  . Colon cancer Other        paternal great-uncle  .  COPD Maternal Aunt   . Liver cancer Maternal Aunt 61  . Colon cancer Other        paternal great-uncle  . Colon cancer Paternal Great-grandfather   . Colon cancer Other        paternal first cousin once removed   Social History   Socioeconomic History  . Marital status: Married    Spouse name: Not on file  . Number of children: Not on file  . Years of education: Not on file  . Highest education level: Not on file  Occupational History  . Not on file  Tobacco Use  . Smoking status: Never Smoker  . Smokeless tobacco: Never Used  Vaping  Use  . Vaping Use: Never used  Substance and Sexual Activity  . Alcohol use: No  . Drug use: No  . Sexual activity: Yes    Birth control/protection: Surgical    Comment: BTL  Other Topics Concern  . Not on file  Social History Narrative   Married 2 kids girls    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[facility] , 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    No drinking smoking    No guns, wears seatbelts    Safe in relationship    Social Determinants of Health   Financial Resource Strain: Not on file  Food Insecurity: Not on file  Transportation Needs: Not on file  Physical Activity: Not on file  Stress: Not on file  Social Connections: Not on file  Intimate Partner Violence: Not on file   Current Meds  Medication Sig  . LOTEMAX SM 0.38 % GEL   . prednisoLONE acetate (PRED FORTE) 1 % ophthalmic suspension SMARTSIG:In Eye(s)   Allergies  Allergen Reactions  . Sulfamethoxazole-Trimethoprim Rash   No results found for this or any previous visit (from the past 2160 hour(s)). Objective  Body mass index is 23.72 kg/m. Wt Readings from Last 3 Encounters:  12/27/20 156 lb (70.8 kg)  08/23/20 156 lb (70.8 kg)  10/05/19 160 lb (72.6 kg)   Temp Readings from Last 3 Encounters:  12/27/20 97.6 F (36.4 C) (Oral)  08/23/20 98.2 F (36.8 C) (Oral)  10/05/19 97.6 F (36.4 C) (Temporal)   BP Readings from Last 3 Encounters:  12/27/20 106/78  08/23/20 106/70  10/05/19 106/72   Pulse Readings from Last 3 Encounters:  12/27/20 74  08/23/20 88  10/05/19 (!) 59    Physical Exam Vitals and nursing note reviewed.  Constitutional:      Appearance: Normal appearance. She is well-developed and well-groomed.  HENT:     Head: Normocephalic and atraumatic.  Eyes:     Conjunctiva/sclera: Conjunctivae normal.     Pupils: Pupils are equal, round, and reactive to light.  Cardiovascular:     Rate and Rhythm: Normal rate and regular rhythm.     Heart sounds: Normal heart sounds. No murmur heard.   Pulmonary:     Effort:  Pulmonary effort is normal.     Breath sounds: Normal breath sounds.  Abdominal:     Tenderness: There is no abdominal tenderness.  Skin:    General: Skin is warm and dry.  Neurological:     General: No focal deficit present.     Mental Status: She is alert and oriented to person, place, and time. Mental status is at baseline.     Gait: Gait normal.  Psychiatric:        Attention and Perception: Attention and perception normal.        Mood and Affect:  Mood and affect normal.        Speech: Speech normal.        Behavior: Behavior normal. Behavior is cooperative.        Thought Content: Thought content normal.        Cognition and Memory: Cognition and memory normal.        Judgment: Judgment normal.     Assessment  Plan  Annual physical exam - Plan: Comprehensive metabolic panel, CBC with Differential/Platelet, Lipid panel, TSH, Urinalysis, Routine w reflex microscopic Had flu shot utd Tdap and hep b titer check with employee health  -Tdap 2018 had ? 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  At this time, it is unknown if this variant is associated with increased cancer risk or if this is a normal finding, but most variants such as this get reclassified to being inconsequential. It should not be used to make medical management decisions.With time, we suspect the lab will determine the significance of this variant, if any. If we do learn more about it, we  will try to contact Ms. Parkerto discuss it further. However, it is important to stay in touch with [place] periodically and keep the address and phone number up to date.  We also discussed that upon review of available records, we were unable to find results indicating that Ms. Koval father had genetic testing for hereditary cancer risk. It appears that genetic testing was performed on his tumor, which revealed a mutation in the KRAS gene. While KRAS mutations identified in tumor testing may provide insight into treatment options for that cancer, it does not indicate a hereditary risk for colorectal cancer or warrant additional germline testing in the patient or relatives.   Chronic uveitis of both eyes - Plan: Ambulatory referral to Ophthalmology, Sjogren's syndrome antibods(ssa + ssb), CMV abs, IgG+IgM R>L but both eyes seeing Dr Posey Pronto rheumatology [facility] who disc mtx vs humira and considering humira to get approved we disc tx options as she has tried steroid drops prednisolone made vision blurry, oral steroids did not like the way I tmade her feel and he disc mtx and humira I also mention azathioprine as a tx option consider oral mtx 1st  She has titers + lyme and hsv 1+ no h/o cat exposure for toxoplasmosis but still is uncertain as to why has uveitis flares and wants to know the cause and wants referral to Dr. Rutherford Nail [facility] in [place] who specializes in this condition  She on Lotemax dm 0.38% gel and prednisolone eye drops     Provider: Dr. Olivia Mackie McLean-Scocuzza-Internal Medicine

## 2020-12-28 ENCOUNTER — Other Ambulatory Visit
Admission: RE | Admit: 2020-12-28 | Discharge: 2020-12-28 | Disposition: A | Discharge status: Home / Self Care | Payer: No Typology Code available for payment source | Source: Ambulatory Visit | Attending: Internal Medicine | Admitting: Internal Medicine

## 2020-12-28 DIAGNOSIS — Z13220 Encounter for screening for lipoid disorders: Secondary | ICD-10-CM | POA: Insufficient documentation

## 2020-12-28 DIAGNOSIS — Z1389 Encounter for screening for other disorder: Secondary | ICD-10-CM | POA: Insufficient documentation

## 2020-12-28 DIAGNOSIS — H2013 Chronic iridocyclitis, bilateral: Secondary | ICD-10-CM | POA: Diagnosis present

## 2020-12-28 DIAGNOSIS — Z13818 Encounter for screening for other digestive system disorders: Secondary | ICD-10-CM | POA: Insufficient documentation

## 2020-12-28 DIAGNOSIS — Z0000 Encounter for general adult medical examination without abnormal findings: Secondary | ICD-10-CM | POA: Insufficient documentation

## 2020-12-28 DIAGNOSIS — Z1329 Encounter for screening for other suspected endocrine disorder: Secondary | ICD-10-CM | POA: Insufficient documentation

## 2020-12-28 LAB — CBC WITH DIFFERENTIAL/PLATELET
Abs Immature Granulocytes: 0.01 10*3/uL (ref 0.00–0.07)
Basophils Absolute: 0 10*3/uL (ref 0.0–0.1)
Basophils Relative: 1 %
Eosinophils Absolute: 0.1 10*3/uL (ref 0.0–0.5)
Eosinophils Relative: 1 %
HCT: 42.6 % (ref 36.0–46.0)
Hemoglobin: 14 g/dL (ref 12.0–15.0)
Immature Granulocytes: 0 %
Lymphocytes Relative: 40 %
Lymphs Abs: 1.9 10*3/uL (ref 0.7–4.0)
MCH: 29.2 pg (ref 26.0–34.0)
MCHC: 32.9 g/dL (ref 30.0–36.0)
MCV: 88.8 fL (ref 80.0–100.0)
Monocytes Absolute: 0.4 10*3/uL (ref 0.1–1.0)
Monocytes Relative: 8 %
Neutro Abs: 2.4 10*3/uL (ref 1.7–7.7)
Neutrophils Relative %: 50 %
Platelets: 190 10*3/uL (ref 150–400)
RBC: 4.8 MIL/uL (ref 3.87–5.11)
RDW: 12.3 % (ref 11.5–15.5)
WBC: 4.7 10*3/uL (ref 4.0–10.5)
nRBC: 0 % (ref 0.0–0.2)

## 2020-12-28 LAB — COMPREHENSIVE METABOLIC PANEL
ALT: 13 U/L (ref 0–44)
AST: 19 U/L (ref 15–41)
Albumin: 4.7 g/dL (ref 3.5–5.0)
Alkaline Phosphatase: 47 U/L (ref 38–126)
Anion gap: 10 (ref 5–15)
BUN: 16 mg/dL (ref 6–20)
CO2: 22 mmol/L (ref 22–32)
Calcium: 9.6 mg/dL (ref 8.9–10.3)
Chloride: 106 mmol/L (ref 98–111)
Creatinine, Ser: 0.8 mg/dL (ref 0.44–1.00)
GFR, Estimated: >60 mL/min (ref >60)
Glucose, Bld: 83 mg/dL (ref 70–99)
Potassium: 3.9 mmol/L (ref 3.5–5.1)
Sodium: 138 mmol/L (ref 135–145)
Total Bilirubin: 2.6 mg/dL — ABNORMAL HIGH (ref 0.3–1.2)
Total Protein: 7.5 g/dL (ref 6.5–8.1)

## 2020-12-28 LAB — LIPID PANEL
Cholesterol: 190 mg/dL (ref 0–200)
HDL: 66 mg/dL (ref >40)
LDL Cholesterol: 114 mg/dL — ABNORMAL HIGH (ref 0–99)
Total CHOL/HDL Ratio: 2.9 RATIO
Triglycerides: 52 mg/dL (ref <150)
VLDL: 10 mg/dL (ref 0–40)

## 2020-12-28 LAB — URINALYSIS, ROUTINE W REFLEX MICROSCOPIC
Bilirubin Urine: NEGATIVE
Glucose, UA: NEGATIVE mg/dL
Hgb urine dipstick: NEGATIVE
Ketones, ur: NEGATIVE mg/dL
Leukocytes,Ua: NEGATIVE
Nitrite: NEGATIVE
Protein, ur: NEGATIVE mg/dL
Specific Gravity, Urine: 1.023 (ref 1.005–1.030)
pH: 5 (ref 5.0–8.0)

## 2020-12-28 LAB — TSH: TSH: 1.818 u[IU]/mL (ref 0.350–4.500)

## 2020-12-28 LAB — HEPATITIS C ANTIBODY: HCV Ab: NONREACTIVE

## 2020-12-29 ENCOUNTER — Encounter: Payer: Self-pay | Admitting: Internal Medicine

## 2020-12-29 LAB — CMV IGM: CMV IgM: <30 AU/mL (ref 0.0–29.9)

## 2020-12-29 LAB — CMV ANTIBODY, IGG (EIA): CMV Ab - IgG: 4.6 U/mL — ABNORMAL HIGH (ref 0.00–0.59)

## 2020-12-29 LAB — SJOGREN'S SYNDROME ANTIBODS(SSA + SSB)
SSA (Ro) (ENA) Antibody, IgG: <0.2 AI (ref 0.0–0.9)
SSB (La) (ENA) Antibody, IgG: <0.2 AI (ref 0.0–0.9)

## 2021-01-02 ENCOUNTER — Encounter: Payer: Self-pay | Admitting: Internal Medicine

## 2021-01-02 DIAGNOSIS — R768 Other specified abnormal immunological findings in serum: Secondary | ICD-10-CM | POA: Insufficient documentation

## 2021-01-02 NOTE — Addendum Note (Signed)
Addended by: Orland Mustard on: 01/02/2021 10:32 AM   Modules accepted: Orders

## 2021-01-03 ENCOUNTER — Encounter: Payer: Self-pay | Admitting: Internal Medicine

## 2021-01-04 NOTE — Telephone Encounter (Signed)
[employer] form has been faxed 01-04-21 to [phone]

## 2021-01-10 ENCOUNTER — Encounter: Payer: Self-pay | Admitting: Internal Medicine

## 2021-01-10 ENCOUNTER — Other Ambulatory Visit: Payer: Self-pay

## 2021-01-10 ENCOUNTER — Ambulatory Visit (INDEPENDENT_AMBULATORY_CARE_PROVIDER_SITE_OTHER): Payer: No Typology Code available for payment source | Admitting: Internal Medicine

## 2021-01-10 VITALS — BP 121/79 | HR 80 | Temp 97.7°F | Wt 154.0 lb

## 2021-01-10 DIAGNOSIS — B009 Herpesviral infection, unspecified: Secondary | ICD-10-CM | POA: Diagnosis not present

## 2021-01-10 DIAGNOSIS — H2013 Chronic iridocyclitis, bilateral: Secondary | ICD-10-CM | POA: Diagnosis not present

## 2021-01-10 DIAGNOSIS — R768 Other specified abnormal immunological findings in serum: Secondary | ICD-10-CM

## 2021-01-10 DIAGNOSIS — Z8619 Personal history of other infectious and parasitic diseases: Secondary | ICD-10-CM | POA: Diagnosis not present

## 2021-01-10 NOTE — Progress Notes (Signed)
[facility] for Infectious Disease  Reason for Consult: Uveitis  Referring Provider: Dr McLean-Scocuzza   HPI:    Stefanie Gomez is a 30 y.o. female with PMHx as below who presents to the clinic for further evaluation of uveitis.   Patient is followed by rheumatology at the [facility] in [place], [place].  She was first evaluated there in October 2020 for recurrent anterior uveitis.  This initially occurred at age 20 then again bilaterally in August 2019 with subsequent recurrent episodes involving either the right or the left side every couple months.  Per rheumatology notes, she was initially evaluated by Dr. Karn Pickler and diagnosed with keratitis/episcleritis/iritis treated with prednisolone drops.  She saw Dr. Jomarie Longs in September 2020 for second opinion and was diagnosed with lateral anterior uveitis treated with Lotemax +1 dose of oral azithromycin for possible chlamydia conjunctivitis coverage.  Labs at that time, per rheumatology note in Oct 2020 and via chart review, were remarkable for normal CBC, normal CMP except for slightly increased bilirubin 1.6, negative HIV, negative RPR, negative RMSF antibodies, normal inflammatory markers, negative QuantiFERON gold, negative H SV 2 antibodies, elevated HSV 1 IgG, negative EBV PCR, negative HCV, negative Lyme antibody screen (no Western blot done given negative screen and no prior history of tick bite or erythema migrans.  See below regarding prior Lyme testing).  It was felt that an infectious etiology was ruled out based on her history and lab evaluation.    She has continued to follow-up with rheumatology and was possibly going to start on Humira in July 2021, however, she ultimately did not get started on this medication due to insurance issues and hesitancy regarding immunosuppression.  Labs at that time were consistent with hepatitis B immunization and negative hepatitis C antibody.  She was most recently seen again by her  rheumatologist on December 15, 2020.  At that visit she was noted to get a flare of her uveitis about every few months and is currently on Lotemax and prednisolone drops for flare.  She was referred to Dr. Manuella Ghazi (ophthalmology) at [facility] for second opinion and her rheumatologist felt that she should consider immunosuppressive therapy.  Of note, April 2020 labwork was notable for a Lyme positive antibody screen.  However, confirmatory Western blot was negative with only 1+ IgM band and 2+ IgG bands.  More recent labs from primary care in February 2022 with negative hepatitis C antibody, negative CMV IgM, positive CMV IgG.  S      Patient's Medications  New Prescriptions   No medications on file  Previous Medications   LOTEMAX SM 0.38 % GEL       PREDNISOLONE ACETATE (PRED FORTE) 1 % OPHTHALMIC SUSPENSION    SMARTSIG:In Eye(s)  Modified Medications   No medications on file  Discontinued Medications   No medications on file      Past Medical History:  Diagnosis Date  . Adenocarcinoma in situ (AIS) of uterine cervix    s/p total hysteretectomy including uterus, cervix fallopian tubes 05/2017 Dr. Georgianne Fick ovaries intact   . Cancer ([place])   . CMV (cytomegalovirus infection) status positive ([place])   . Complication of anesthesia   . COVID-19    07/2020  . Family history of adverse reaction to anesthesia    SISTER-HARD TIME WAKING UP  . Family history of colorectal cancer   . Family history of liver cancer   . HSV-1 (herpes simplex virus 1) infection   . Medical history non-contributory   .  PONV (postoperative nausea and vomiting) 09/2016   X 1 AFTER BTL  . Positive Lyme disease serology     Social History   Tobacco Use  . Smoking status: Never Smoker  . Smokeless tobacco: Never Used  Vaping Use  . Vaping Use: Never used  Substance Use Topics  . Alcohol use: No  . Drug use: No    Family History  Problem Relation Age of Onset  . Rectal cancer Father 62       metastasized to  lung  . Pulmonary embolism Father   . Hyperlipidemia Mother   . Hypertension Mother   . Breast cancer Mother 87  . Colon cancer Other        paternal great-uncle  . COPD Maternal Aunt   . Liver cancer Maternal Aunt 61  . Colon cancer Other        paternal great-uncle  . Colon cancer Paternal Great-grandfather   . Colon cancer Other        paternal first cousin once removed    Allergies  Allergen Reactions  . Sulfamethoxazole-Trimethoprim Rash    Review of Systems  Constitutional: Negative.   Eyes: Positive for redness.  Cardiovascular: Negative.   Gastrointestinal: Negative.   Genitourinary: Negative.       OBJECTIVE:    Vitals:   01/10/21 1552  BP: 121/79  Pulse: 80  Temp: 97.7 F (36.5 C)  TempSrc: Oral  Weight: 154 lb (69.9 kg)     Body mass index is 23.42 kg/m.  Physical Exam Constitutional:      General: She is not in acute distress.    Appearance: Normal appearance.  HENT:     Head: Normocephalic and atraumatic.  Eyes:     Comments: Right eye slightly injected.  Left eye normal.   Neurological:     General: No focal deficit present.     Mental Status: She is alert and oriented to person, place, and time.  Psychiatric:        Mood and Affect: Mood normal.        Behavior: Behavior normal.      Labs and Microbiology:  CBC Latest Ref Rng & Units 12/28/2020 03/05/2019 01/21/2018  WBC 4.0 - 10.5 K/uL 4.7 5.2 5.3  Hemoglobin 12.0 - 15.0 g/dL 14.0 14.8 14.0  Hematocrit 36.0 - 46.0 % 42.6 43.9 41.0  Platelets 150 - 400 K/uL 190 185.0 180.0   CMP Latest Ref Rng & Units 12/28/2020 03/05/2019 07/04/2018  Glucose 70 - 99 mg/dL 83 79 92  BUN 6 - 20 mg/dL 16 12 11   Creatinine 0.44 - 1.00 mg/dL 0.80 0.80 0.73  Sodium 135 - 145 mmol/L 138 140 141  Potassium 3.5 - 5.1 mmol/L 3.9 3.8 4.3  Chloride 98 - 111 mmol/L 106 105 102  CO2 22 - 32 mmol/L 22 26 26   Calcium 8.9 - 10.3 mg/dL 9.6 9.7 10.0  Total Protein 6.5 - 8.1 g/dL 7.5 7.4 6.8  Total Bilirubin 0.3  - 1.2 mg/dL 2.6(H) 1.6(H) 1.6(H)  Alkaline Phos 38 - 126 U/L 47 54 57  AST 15 - 41 U/L 19 15 14   ALT 0 - 44 U/L 13 11 12       ASSESSMENT & PLAN:    1. Chronic uveitis of both eyes  Patient with history of chronic uveitis and is here today to discuss potential for an infectious etiology.  She has negative screening for HIV and syphilis and reports no increased risk factors for these infections.  April  2020 Lyme serology represents a false positive antibody screen based on negative confirmatory Western blot and follow up negative antibody screen in October 2020 so this is not a potential etiology for her.  Discussed her CMV IgG seropositivity and that ocular disease from CMV in the absence of advanced immunosuppression is very unlikely and would have been diagnosed on the basis of characteristic retinal changes or PCR testing from eye fluid.  Ultimately, discussed that she has just had prior exposure to CMV but that antibodies are not useful in diagnosis of retinitis.  Similarly, she is HSV-1 antibody positive but diagnosis of ocular disease would be based on characteristic ophthalmologic findings as well.  I do not believe that she has an infectious etiology to her uveitis based on the work up she has had thus far.  She is interested in seeing a rheumatologist within [facility] network.  I have recommended Dr Ignacia Marvel whom she would be interested in seeing.  She reports needing this referral to come from her PCP so will route note to Dr McLean-Scocuzza per patient request.  Will have her follow up with [place] as needed.    Raynelle Highland for Infectious Disease [facility] 01/10/2021, 4:48 PM  I spent 45 minutes dedicated to the care of this patient on the date of this encounter to include pre-visit review of records, face-to-face time with the patient discussing uveitis, lyme serology, CMV serology.

## 2021-01-10 NOTE — Addendum Note (Signed)
Addended by: Orland Mustard on: 01/10/2021 05:36 PM   Modules accepted: Orders

## 2021-01-10 NOTE — Patient Instructions (Signed)
Thank you for coming to see me today. It was a pleasure seeing you.  To Do: Marland Kitchen I do not think that you have infection as a cause of your uveitis . Consider referral to rheumatology (Dr Ignacia Marvel) in [place] for further discussion on immunosuppression options . You can follow up with me as needed  If you have any questions or concerns, please do not hesitate to call the office at [phone].  Take Care,   Jule Ser

## 2021-01-13 ENCOUNTER — Ambulatory Visit
Admission: RE | Admit: 2021-01-13 | Discharge: 2021-01-13 | Disposition: A | Discharge status: Home / Self Care | Payer: No Typology Code available for payment source | Source: Ambulatory Visit | Attending: Internal Medicine | Admitting: Internal Medicine

## 2021-01-13 ENCOUNTER — Other Ambulatory Visit: Payer: Self-pay

## 2021-01-13 DIAGNOSIS — R748 Abnormal levels of other serum enzymes: Secondary | ICD-10-CM | POA: Diagnosis present

## 2021-01-13 DIAGNOSIS — R17 Unspecified jaundice: Secondary | ICD-10-CM | POA: Diagnosis present

## 2021-02-10 ENCOUNTER — Telehealth: Payer: Self-pay | Admitting: Internal Medicine

## 2021-02-10 NOTE — Telephone Encounter (Signed)
Error

## 2021-02-10 NOTE — Telephone Encounter (Signed)
Patient called and said her insurance never received her medical records with the form that was completed. Medical records are needed to process referral.

## 2021-02-13 NOTE — Progress Notes (Signed)
Office Visit Note  Patient: Stefanie Gomez             Date of Birth: May 12, 1991           MRN: [id number]             PCP: McLean-Scocuzza, Nino Glow, MD Referring: Orland Mustard * Visit Date: 02/14/2021 Occupation: [facility]  Subjective:  New Patient (Initial Visit) (Patient is here for second opinion. Patient complains of eye pain and visual disturbance (blurred vision) - patient notices flares every 6 months or so and treats symptoms with prednisone eye drops X 1 month and notices improvement in symptoms. )   History of Present Illness: Stefanie Gomez is a 30 y.o. female her for evaluation of chronic, episodic anterior uveitis of both eyes. Symptoms started over a decade ago but improved with remission off treatment until about 2019 developed symptoms again. She sees ophthalmology and is treating symptoms with topical steroid drops but no systemic therapies. She saw Dr. 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 Currently she has mild symptoms pretty often with occasional symptoms flare up of pain about every 6 months.  Labs reviewed CBC wnl CMP wnl ESR 4 CRP <1 HLA-B27 neg TB neg RF neg CCP neg EBV neg RMSF neg HIV neg HSV neg Lyme neg/few WB neg  Imaging reviewed CXR neg    Activities of Daily Living:  Patient reports morning stiffness for 0 minutes.   Patient Reports nocturnal pain.  Difficulty dressing/grooming: Denies Difficulty climbing stairs: Denies Difficulty getting out of chair: Denies Difficulty using hands for taps, buttons, cutlery, and/or writing: Denies  Review of Systems  Constitutional: Positive for fatigue.  HENT: Negative for mouth sores, mouth dryness and nose dryness.   Eyes: Positive for pain and visual disturbance. Negative for  itching and dryness.  Respiratory: Negative for cough, hemoptysis, shortness of breath and difficulty breathing.   Cardiovascular: Negative for chest pain, palpitations and swelling in legs/feet.  Gastrointestinal: Negative for abdominal pain, blood in stool, constipation and diarrhea.  Endocrine: Negative for increased urination.  Genitourinary: Negative for painful urination.  Musculoskeletal: Negative for arthralgias, joint pain, joint swelling, myalgias, muscle weakness, morning stiffness, muscle tenderness and myalgias.  Skin: Negative for color change, rash and redness.  Allergic/Immunologic: Negative for susceptible to infections.  Neurological: Negative for dizziness, numbness, headaches, memory loss and weakness.  Hematological: Negative for swollen glands.  Psychiatric/Behavioral: Negative for confusion and sleep disturbance.    PMFS History:  Patient Active Problem List   Diagnosis Date Noted  . CMV (cytomegalovirus) antibody positive 01/02/2021  . Chronic uveitis of both eyes 12/27/2020  . Annual physical exam 12/27/2020  . Cutaneous abscess of umbilicus 76/19/5093  . Genetic testing 02/26/2020  . Hx of colonic polyp 02/15/2020  . Family history of gene mutation 02/15/2020  . Family history of colorectal cancer   . Family history of liver cancer   . Iritis 03/12/2019  . History of Lyme disease 03/12/2019  . S/P laparoscopic hysterectomy 05/07/2017  . High grade squamous intraepithelial cervical dysplasia 02/04/2017    Past Medical History:  Diagnosis Date  . Adenocarcinoma in situ (AIS) of uterine cervix    s/p total hysteretectomy including uterus, cervix fallopian tubes 05/2017 Dr. Georgianne Fick ovaries intact   . Cancer ([place])   . CMV (cytomegalovirus infection) status positive (  [place])   . Complication of anesthesia   . COVID-19    07/2020  . Family history of adverse reaction to anesthesia    SISTER-HARD TIME WAKING UP  . Family history of colorectal cancer   . Family  history of liver cancer   . HSV-1 (herpes simplex virus 1) infection   . Medical history non-contributory   . PONV (postoperative nausea and vomiting) 09/2016   X 1 AFTER BTL  . Positive Lyme disease serology     Family History  Problem Relation Age of Onset  . Rectal cancer Father 42       metastasized to lung  . Pulmonary embolism Father   . Hyperlipidemia Mother   . Hypertension Mother   . Breast cancer Mother 21  . Colon cancer Other        paternal great-uncle  . COPD Maternal Aunt   . Liver cancer Maternal Aunt 61  . Colon cancer Other        paternal great-uncle  . Colon cancer Paternal Great-grandfather   . Colon cancer Other        paternal first cousin once removed  . Healthy Daughter   . Healthy Daughter    Past Surgical History:  Procedure Laterality Date  . ABDOMINAL HYSTERECTOMY     05/2017 Dr. Georgianne Fick   . APPENDECTOMY     2010  . CERVICAL CONE BIOPSY  03/05/2017   [facility]  . CERVICAL CONIZATION W/BX N/A 03/05/2017   Procedure: CONIZATION CERVIX WITH BIOPSY;  Surgeon: Malachy Mood, MD;  Location: [facility];  Service: Gynecology;  Laterality: N/A;  . COLONOSCOPY WITH PROPOFOL N/A 10/05/2019   Procedure: COLONOSCOPY WITH PROPOFOL;  Surgeon: Toledo, Benay Pike, MD;  Location: [facility];  Service: Gastroenterology;  Laterality: N/A;  . CYSTOSCOPY N/A 05/07/2017   Procedure: CYSTOSCOPY;  Surgeon: Malachy Mood, MD;  Location: [facility];  Service: Gynecology;  Laterality: N/A;  . LAPAROSCOPIC HYSTERECTOMY Bilateral 05/07/2017   Procedure: HYSTERECTOMY TOTAL LAPAROSCOPIC BILATERAL SALPINGECTOMY;  Surgeon: Malachy Mood, MD;  Location: [facility];  Service: Gynecology;  Laterality: Bilateral;  . LAPAROSCOPIC TUBAL LIGATION Bilateral 09/25/2016   Procedure: LAPAROSCOPIC TUBAL LIGATION;  Surgeon: Malachy Mood, MD;  Location: [facility];  Service: Gynecology;  Laterality: Bilateral;  . TONSILLECTOMY    . TUBAL LIGATION  09/2016  . WISDOM TOOTH  EXTRACTION     Social History   Social History Narrative   Married 2 kids girls    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[facility] , 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    No drinking smoking    No guns, wears seatbelts    Safe in relationship    Immunization History  Administered Date(s) Administered  . Influenza,inj,Quad PF,6+ Mos 08/05/2016  . Influenza-Unspecified 07/15/2017, 08/05/2019, 08/09/2020  . PFIZER(Purple Top)SARS-COV-2 Vaccination 06/17/2020, 07/08/2020     Objective: Vital Signs: BP 104/70 (BP Location: Right Arm, Patient Position: Sitting, Cuff Size: Normal)   Pulse 74   Ht 5' 8.25" (1.734 m)   Wt 153 lb (69.4 kg)   LMP 02/06/2017 (Approximate)   BMI 23.09 kg/m    Physical Exam HENT:     Right Ear: External ear normal.     Left Ear: External ear normal.  Cardiovascular:     Rate and Rhythm: Normal rate and regular rhythm.  Pulmonary:     Effort: Pulmonary effort is normal.     Breath sounds: Normal breath sounds.  Skin:    General: Skin is warm and dry.     Findings: No  rash.  Neurological:     General: No focal deficit present.     Mental Status: She is alert.  Psychiatric:        Mood and Affect: Mood normal.     Musculoskeletal Exam:  Elbows full ROM no tenderness or swelling Wrists full ROM no tenderness or swelling Fingers full ROM no tenderness or swelling Knees full ROM no tenderness or swelling Ankles full ROM no tenderness or swelling  Investigation: No additional findings.  Imaging: No results found.  Recent Labs: Lab Results  Component Value Date   WBC 4.7 12/28/2020   HGB 14.0 12/28/2020   PLT 190 12/28/2020   NA 138 12/28/2020   K 3.9 12/28/2020   CL 106 12/28/2020   CO2 22 12/28/2020   GLUCOSE 83 12/28/2020   BUN 16 12/28/2020   CREATININE 0.80 12/28/2020   BILITOT 2.6 (H) 12/28/2020   ALKPHOS 47 12/28/2020   AST 19 12/28/2020   ALT 13 12/28/2020   PROT 7.5 12/28/2020   ALBUMIN 4.7 12/28/2020   CALCIUM 9.6 12/28/2020   GFRAA 131 07/04/2018    QFTBGOLDPLUS NEGATIVE 03/05/2019    Speciality Comments: No specialty comments available.  Procedures:  No procedures performed Allergies: Sulfamethoxazole-trimethoprim   Assessment / Plan:     Visit Diagnoses: Chronic uveitis of both eyes  Chronic recurrent anterior uveitis involving both eyes with negative autoimmune serology and no systemic complaints. She reports good symptom improvement with steroid use and no complications or increase intraocular pressure reported so far. It is not clear whether any accumulation of damage occurring with this treatment plan, and I believe she would benefit with evaluation by a uveitis and retina expert she was previously referred to Dr. Manuella Ghazi. Based on her biggest concern of DMARD treatment is infections I discussed methotrexate treatment as an option and provided some information about this to review.  Orders: No orders of the defined types were placed in this encounter.  No orders of the defined types were placed in this encounter.   Follow-Up Instructions: No follow-ups on file.   Collier Salina, MD  Note - This record has been created using [employer].  Chart creation errors have been sought, but may not always  have been located. Such creation errors do not reflect on  the standard of medical care.

## 2021-02-14 ENCOUNTER — Other Ambulatory Visit: Payer: Self-pay

## 2021-02-14 ENCOUNTER — Encounter: Payer: Self-pay | Admitting: Internal Medicine

## 2021-02-14 ENCOUNTER — Ambulatory Visit (INDEPENDENT_AMBULATORY_CARE_PROVIDER_SITE_OTHER): Payer: No Typology Code available for payment source | Admitting: Internal Medicine

## 2021-02-14 VITALS — BP 104/70 | HR 74 | Ht 68.25 in | Wt 153.0 lb

## 2021-02-14 DIAGNOSIS — H2013 Chronic iridocyclitis, bilateral: Secondary | ICD-10-CM | POA: Diagnosis not present

## 2021-02-14 NOTE — Telephone Encounter (Signed)
Spoke with Patient and she states they will need the office notes where the condition Patient was referred for was addressed. Printed Patient's last 2 notes and will fax to the number below.

## 2021-02-15 NOTE — Telephone Encounter (Signed)
I conference a call to [facility] with pt regarding form that needed to be filled out. Per rep at [facility] form office notes was received and that the form and office notes has to be approved before appt can be scheduled.

## 2021-03-03 ENCOUNTER — Encounter: Payer: Self-pay | Admitting: Internal Medicine

## 2021-03-14 ENCOUNTER — Telehealth: Payer: Self-pay | Admitting: Internal Medicine

## 2021-03-14 NOTE — Telephone Encounter (Signed)
Received a call from [place] with [employer] in regards to patient's exception form to see an out of network provider for her eyes.   Ciara states that they did not receive the form and chart notes. Informed her that per Patient message encounter on 01/02/2021, a rep at [facility] confirmed that on 02/15/21 they received the form and office notes.   Was placed on hold while they went to confirm. They state that they do have the medical records and forms. States this was put in the wrong place.   States they will call back tomorrow with determination if the request was approved.

## 2021-04-21 ENCOUNTER — Other Ambulatory Visit: Payer: Self-pay

## 2021-04-21 ENCOUNTER — Encounter: Payer: Self-pay | Admitting: Internal Medicine

## 2021-04-21 MED ORDER — PREDNISOLONE ACETATE 1 % OP SUSP
OPHTHALMIC | 5 refills | Status: DC
  Filled 2021-04-21: qty 10, 15d supply, fill #0

## 2021-04-21 MED ORDER — MYCOPHENOLATE MOFETIL 500 MG PO TABS
ORAL_TABLET | ORAL | 3 refills | Status: DC
  Filled 2021-04-21: qty 120, 30d supply, fill #0
  Filled 2021-06-29: qty 120, 30d supply, fill #1
  Filled 2021-08-31: qty 120, 30d supply, fill #2
  Filled 2021-09-25: qty 120, 30d supply, fill #3

## 2021-04-21 NOTE — Telephone Encounter (Signed)
Please advise 

## 2021-04-26 ENCOUNTER — Other Ambulatory Visit: Payer: Self-pay

## 2021-04-28 ENCOUNTER — Other Ambulatory Visit
Admission: RE | Admit: 2021-04-28 | Payer: No Typology Code available for payment source | Source: Ambulatory Visit | Admitting: *Deleted

## 2021-05-02 ENCOUNTER — Other Ambulatory Visit: Payer: Self-pay | Admitting: Ophthalmology

## 2021-05-02 ENCOUNTER — Other Ambulatory Visit
Admission: RE | Admit: 2021-05-02 | Discharge: 2021-05-02 | Disposition: A | Discharge status: Home / Self Care | Payer: No Typology Code available for payment source | Attending: Ophthalmology | Admitting: Ophthalmology

## 2021-05-02 DIAGNOSIS — H30033 Focal chorioretinal inflammation, peripheral, bilateral: Secondary | ICD-10-CM | POA: Insufficient documentation

## 2021-05-02 LAB — CBC WITH DIFFERENTIAL/PLATELET
Abs Immature Granulocytes: 0.01 10*3/uL (ref 0.00–0.07)
Basophils Absolute: 0 10*3/uL (ref 0.0–0.1)
Basophils Relative: 1 %
Eosinophils Absolute: 0.1 10*3/uL (ref 0.0–0.5)
Eosinophils Relative: 1 %
HCT: 41 % (ref 36.0–46.0)
Hemoglobin: 13.7 g/dL (ref 12.0–15.0)
Immature Granulocytes: 0 %
Lymphocytes Relative: 43 %
Lymphs Abs: 2.4 10*3/uL (ref 0.7–4.0)
MCH: 29.9 pg (ref 26.0–34.0)
MCHC: 33.4 g/dL (ref 30.0–36.0)
MCV: 89.5 fL (ref 80.0–100.0)
Monocytes Absolute: 0.4 10*3/uL (ref 0.1–1.0)
Monocytes Relative: 7 %
Neutro Abs: 2.7 10*3/uL (ref 1.7–7.7)
Neutrophils Relative %: 48 %
Platelets: 163 10*3/uL (ref 150–400)
RBC: 4.58 MIL/uL (ref 3.87–5.11)
RDW: 12.3 % (ref 11.5–15.5)
WBC: 5.6 10*3/uL (ref 4.0–10.5)
nRBC: 0 % (ref 0.0–0.2)

## 2021-05-02 LAB — COMPREHENSIVE METABOLIC PANEL
ALT: 21 U/L (ref 0–44)
AST: 19 U/L (ref 15–41)
Albumin: 4.7 g/dL (ref 3.5–5.0)
Alkaline Phosphatase: 56 U/L (ref 38–126)
Anion gap: 4 — ABNORMAL LOW (ref 5–15)
BUN: 10 mg/dL (ref 6–20)
CO2: 27 mmol/L (ref 22–32)
Calcium: 9.6 mg/dL (ref 8.9–10.3)
Chloride: 106 mmol/L (ref 98–111)
Creatinine, Ser: 0.75 mg/dL (ref 0.44–1.00)
GFR, Estimated: >60 mL/min (ref >60)
Glucose, Bld: 82 mg/dL (ref 70–99)
Potassium: 4 mmol/L (ref 3.5–5.1)
Sodium: 137 mmol/L (ref 135–145)
Total Bilirubin: 1.5 mg/dL — ABNORMAL HIGH (ref 0.3–1.2)
Total Protein: 7.5 g/dL (ref 6.5–8.1)

## 2021-05-02 LAB — HIV ANTIBODY (ROUTINE TESTING W REFLEX): HIV Screen 4th Generation wRfx: NONREACTIVE

## 2021-05-03 LAB — MISC LABCORP TEST (SEND OUT)
Labcorp test code: 82370
Labcorp test code: 96289

## 2021-05-03 LAB — LYSOZYME, SERUM: Lysozyme: 5.5 ug/mL (ref 2.5–12.9)

## 2021-05-03 LAB — ANCA TITERS
Atypical P-ANCA titer: <1:20 {titer}
C-ANCA: <1:20 {titer}
P-ANCA: <1:20 {titer}

## 2021-05-03 LAB — ANGIOTENSIN CONVERTING ENZYME: Angiotensin-Converting Enzyme: 33 U/L (ref 14–82)

## 2021-05-04 LAB — MISC LABCORP TEST (SEND OUT): Labcorp test code: 142455

## 2021-05-04 LAB — CYCLIC CITRUL PEPTIDE ANTIBODY, IGG/IGA: CCP Antibodies IgG/IgA: 6 units (ref 0–19)

## 2021-05-06 LAB — QUANTIFERON-TB GOLD PLUS: QuantiFERON-TB Gold Plus: NEGATIVE

## 2021-05-06 LAB — QUANTIFERON-TB GOLD PLUS (RQFGPL)
QuantiFERON Mitogen Value: >10 IU/mL
QuantiFERON Nil Value: 0.01 IU/mL
QuantiFERON TB1 Ag Value: 0.01 IU/mL
QuantiFERON TB2 Ag Value: 0.01 IU/mL

## 2021-05-09 LAB — MISC LABCORP TEST (SEND OUT): Labcorp test code: 16824

## 2021-05-11 LAB — MISC LABCORP TEST (SEND OUT): Labcorp test code: 176076

## 2021-05-24 ENCOUNTER — Other Ambulatory Visit: Payer: Self-pay

## 2021-05-24 ENCOUNTER — Telehealth: Payer: No Typology Code available for payment source | Admitting: Family Medicine

## 2021-05-24 DIAGNOSIS — R300 Dysuria: Secondary | ICD-10-CM | POA: Diagnosis not present

## 2021-05-24 MED ORDER — NITROFURANTOIN MONOHYD MACRO 100 MG PO CAPS
100.0000 mg | ORAL_CAPSULE | Freq: Two times a day (BID) | ORAL | 0 refills | Status: AC
Start: 2021-05-24 — End: 2021-05-30
  Filled 2021-05-24: qty 10, 5d supply, fill #0

## 2021-05-24 NOTE — Progress Notes (Signed)

## 2021-06-01 ENCOUNTER — Encounter: Payer: Self-pay | Admitting: Ophthalmology

## 2021-06-01 LAB — MISC LABCORP TEST (SEND OUT): Labcorp test code: 9985

## 2021-06-29 ENCOUNTER — Other Ambulatory Visit: Payer: Self-pay

## 2021-07-25 ENCOUNTER — Other Ambulatory Visit: Payer: Self-pay

## 2021-07-25 MED ORDER — MYCOPHENOLATE MOFETIL 500 MG PO TABS
ORAL_TABLET | ORAL | 3 refills | Status: DC
  Filled 2021-07-25: qty 120, 30d supply, fill #0
  Filled 2021-10-26: qty 120, 30d supply, fill #1
  Filled 2021-11-29: qty 120, 30d supply, fill #2
  Filled 2021-12-27: qty 120, 30d supply, fill #3

## 2021-07-27 ENCOUNTER — Other Ambulatory Visit
Admission: RE | Admit: 2021-07-27 | Discharge: 2021-07-27 | Disposition: A | Discharge status: Home / Self Care | Payer: No Typology Code available for payment source | Attending: Ophthalmology | Admitting: Ophthalmology

## 2021-07-27 DIAGNOSIS — Z5181 Encounter for therapeutic drug level monitoring: Secondary | ICD-10-CM | POA: Diagnosis not present

## 2021-07-27 DIAGNOSIS — H30033 Focal chorioretinal inflammation, peripheral, bilateral: Secondary | ICD-10-CM | POA: Insufficient documentation

## 2021-07-27 LAB — CBC WITH DIFFERENTIAL/PLATELET
Abs Immature Granulocytes: 0.01 10*3/uL (ref 0.00–0.07)
Basophils Absolute: 0 10*3/uL (ref 0.0–0.1)
Basophils Relative: 1 %
Eosinophils Absolute: 0.1 10*3/uL (ref 0.0–0.5)
Eosinophils Relative: 1 %
HCT: 39.3 % (ref 36.0–46.0)
Hemoglobin: 13.3 g/dL (ref 12.0–15.0)
Immature Granulocytes: 0 %
Lymphocytes Relative: 38 %
Lymphs Abs: 2.1 10*3/uL (ref 0.7–4.0)
MCH: 30.4 pg (ref 26.0–34.0)
MCHC: 33.8 g/dL (ref 30.0–36.0)
MCV: 89.9 fL (ref 80.0–100.0)
Monocytes Absolute: 0.4 10*3/uL (ref 0.1–1.0)
Monocytes Relative: 8 %
Neutro Abs: 2.9 10*3/uL (ref 1.7–7.7)
Neutrophils Relative %: 52 %
Platelets: 176 10*3/uL (ref 150–400)
RBC: 4.37 MIL/uL (ref 3.87–5.11)
RDW: 12.6 % (ref 11.5–15.5)
WBC: 5.6 10*3/uL (ref 4.0–10.5)
nRBC: 0 % (ref 0.0–0.2)

## 2021-07-27 LAB — COMPREHENSIVE METABOLIC PANEL
ALT: 25 U/L (ref 0–44)
AST: 21 U/L (ref 15–41)
Albumin: 4.4 g/dL (ref 3.5–5.0)
Alkaline Phosphatase: 48 U/L (ref 38–126)
Anion gap: 10 (ref 5–15)
BUN: 13 mg/dL (ref 6–20)
CO2: 24 mmol/L (ref 22–32)
Calcium: 9.7 mg/dL (ref 8.9–10.3)
Chloride: 101 mmol/L (ref 98–111)
Creatinine, Ser: 0.69 mg/dL (ref 0.44–1.00)
GFR, Estimated: >60 mL/min (ref >60)
Glucose, Bld: 80 mg/dL (ref 70–99)
Potassium: 3.6 mmol/L (ref 3.5–5.1)
Sodium: 135 mmol/L (ref 135–145)
Total Bilirubin: 1.9 mg/dL — ABNORMAL HIGH (ref 0.3–1.2)
Total Protein: 7.1 g/dL (ref 6.5–8.1)

## 2021-08-31 ENCOUNTER — Other Ambulatory Visit: Payer: Self-pay

## 2021-09-01 ENCOUNTER — Other Ambulatory Visit: Payer: Self-pay

## 2021-09-06 ENCOUNTER — Telehealth (INDEPENDENT_AMBULATORY_CARE_PROVIDER_SITE_OTHER): Payer: No Typology Code available for payment source | Admitting: Physician Assistant

## 2021-09-06 ENCOUNTER — Other Ambulatory Visit: Payer: Self-pay

## 2021-09-06 DIAGNOSIS — R0982 Postnasal drip: Secondary | ICD-10-CM

## 2021-09-06 MED ORDER — AZELASTINE-FLUTICASONE 137-50 MCG/ACT NA SUSP
1.0000 | Freq: Two times a day (BID) | NASAL | 0 refills | Status: DC
  Filled 2021-09-06: qty 23, 30d supply, fill #0

## 2021-09-06 NOTE — Progress Notes (Signed)
Virtual Visit via Video Note  I connected with  Stefanie Gomez  on 09/06/21 at 11:30 AM EDT by a video enabled telemedicine application and verified that I am speaking with the correct person using two identifiers.  Location: Patient: Parked car Provider: Therapist, music at [place] present: Patient and myself   I discussed the limitations of evaluation and management by telemedicine and the availability of in person appointments. The patient expressed understanding and agreed to proceed.   History of Present Illness: Chief complaint: Runny nose Symptom onset: 3 weeks  Pertinent positives: Cough, headache Pertinent negatives: ST, fever, chills, body aches, N/V/D, sinus pressure Treatments tried: Tylenol Cold & Flu  Vaccine status: Flu vaccine UTD about 4 weeks ago Sick exposure: No known sick exposure  Started taking CellCept in July for chronic uveitis and wants to know what to take that won't interfere with this medication.    Observations/Objective:   Gen: Awake, alert, no acute distress Resp: Breathing is even and non-labored Psych: calm/pleasant demeanor Neuro: Alert and Oriented x 3, + facial symmetry, speech is clear.   Assessment and Plan:  1. Postnasal drip -Most likely postnasal drip contributing to symptoms -Trial Azelastine nasal spray -Nasal saline, fluids, humidifier -Recheck with PCP in person if worse or no improvement   Follow Up Instructions:    I discussed the assessment and treatment plan with the patient. The patient was provided an opportunity to ask questions and all were answered. The patient agreed with the plan and demonstrated an understanding of the instructions.   The patient was advised to call back or seek an in-person evaluation if the symptoms worsen or if the condition fails to improve as anticipated.  Doyl Bitting M Arya Luttrull, PA-C

## 2021-09-07 ENCOUNTER — Other Ambulatory Visit: Payer: Self-pay

## 2021-09-08 ENCOUNTER — Other Ambulatory Visit: Payer: Self-pay

## 2021-09-26 ENCOUNTER — Other Ambulatory Visit: Payer: Self-pay

## 2021-10-26 ENCOUNTER — Other Ambulatory Visit: Payer: Self-pay

## 2021-10-27 ENCOUNTER — Other Ambulatory Visit: Payer: Self-pay

## 2021-10-31 ENCOUNTER — Other Ambulatory Visit: Payer: Self-pay

## 2021-10-31 MED ORDER — MYCOPHENOLATE MOFETIL 500 MG PO TABS
ORAL_TABLET | ORAL | 5 refills | Status: AC
  Filled 2021-10-31 – 2022-01-28 (×2): qty 360, 90d supply, fill #0
  Filled 2022-04-27: qty 360, 90d supply, fill #1
  Filled 2022-08-01: qty 360, 90d supply, fill #2

## 2021-11-01 ENCOUNTER — Other Ambulatory Visit
Admission: RE | Admit: 2021-11-01 | Discharge: 2021-11-01 | Disposition: A | Discharge status: Home / Self Care | Payer: No Typology Code available for payment source | Attending: Ophthalmology | Admitting: Ophthalmology

## 2021-11-01 DIAGNOSIS — H44113 Panuveitis, bilateral: Secondary | ICD-10-CM | POA: Insufficient documentation

## 2021-11-01 LAB — COMPREHENSIVE METABOLIC PANEL
ALT: 12 U/L (ref 0–44)
AST: 15 U/L (ref 15–41)
Albumin: 4.3 g/dL (ref 3.5–5.0)
Alkaline Phosphatase: 63 U/L (ref 38–126)
Anion gap: 5 (ref 5–15)
BUN: 10 mg/dL (ref 6–20)
CO2: 26 mmol/L (ref 22–32)
Calcium: 9.1 mg/dL (ref 8.9–10.3)
Chloride: 105 mmol/L (ref 98–111)
Creatinine, Ser: 0.7 mg/dL (ref 0.44–1.00)
GFR, Estimated: >60 mL/min (ref >60)
Glucose, Bld: 86 mg/dL (ref 70–99)
Potassium: 3.9 mmol/L (ref 3.5–5.1)
Sodium: 136 mmol/L (ref 135–145)
Total Bilirubin: 1.3 mg/dL — ABNORMAL HIGH (ref 0.3–1.2)
Total Protein: 6.9 g/dL (ref 6.5–8.1)

## 2021-11-01 LAB — CBC WITH DIFFERENTIAL/PLATELET
Abs Immature Granulocytes: 0.01 10*3/uL (ref 0.00–0.07)
Basophils Absolute: 0 10*3/uL (ref 0.0–0.1)
Basophils Relative: 1 %
Eosinophils Absolute: 0 10*3/uL (ref 0.0–0.5)
Eosinophils Relative: 1 %
HCT: 41.5 % (ref 36.0–46.0)
Hemoglobin: 13.8 g/dL (ref 12.0–15.0)
Immature Granulocytes: 0 %
Lymphocytes Relative: 47 %
Lymphs Abs: 1.9 10*3/uL (ref 0.7–4.0)
MCH: 29.4 pg (ref 26.0–34.0)
MCHC: 33.3 g/dL (ref 30.0–36.0)
MCV: 88.5 fL (ref 80.0–100.0)
Monocytes Absolute: 0.2 10*3/uL (ref 0.1–1.0)
Monocytes Relative: 6 %
Neutro Abs: 1.8 10*3/uL (ref 1.7–7.7)
Neutrophils Relative %: 45 %
Platelets: 168 10*3/uL (ref 150–400)
RBC: 4.69 MIL/uL (ref 3.87–5.11)
RDW: 12 % (ref 11.5–15.5)
WBC: 3.9 10*3/uL — ABNORMAL LOW (ref 4.0–10.5)
nRBC: 0 % (ref 0.0–0.2)

## 2021-11-29 ENCOUNTER — Other Ambulatory Visit: Payer: Self-pay

## 2021-12-01 ENCOUNTER — Other Ambulatory Visit: Payer: Self-pay

## 2021-12-13 ENCOUNTER — Other Ambulatory Visit
Admission: RE | Admit: 2021-12-13 | Discharge: 2021-12-13 | Disposition: A | Discharge status: Home / Self Care | Payer: No Typology Code available for payment source | Source: Ambulatory Visit | Attending: Ophthalmology | Admitting: Ophthalmology

## 2021-12-13 DIAGNOSIS — H30033 Focal chorioretinal inflammation, peripheral, bilateral: Secondary | ICD-10-CM | POA: Insufficient documentation

## 2021-12-13 DIAGNOSIS — Z5181 Encounter for therapeutic drug level monitoring: Secondary | ICD-10-CM | POA: Insufficient documentation

## 2021-12-13 LAB — CBC WITH DIFFERENTIAL/PLATELET
Abs Immature Granulocytes: 0.01 10*3/uL (ref 0.00–0.07)
Basophils Absolute: 0 10*3/uL (ref 0.0–0.1)
Basophils Relative: 1 %
Eosinophils Absolute: 0.1 10*3/uL (ref 0.0–0.5)
Eosinophils Relative: 1 %
HCT: 39.7 % (ref 36.0–46.0)
Hemoglobin: 13.1 g/dL (ref 12.0–15.0)
Immature Granulocytes: 0 %
Lymphocytes Relative: 35 %
Lymphs Abs: 1.8 10*3/uL (ref 0.7–4.0)
MCH: 29.1 pg (ref 26.0–34.0)
MCHC: 33 g/dL (ref 30.0–36.0)
MCV: 88.2 fL (ref 80.0–100.0)
Monocytes Absolute: 0.4 10*3/uL (ref 0.1–1.0)
Monocytes Relative: 8 %
Neutro Abs: 2.7 10*3/uL (ref 1.7–7.7)
Neutrophils Relative %: 55 %
Platelets: 193 10*3/uL (ref 150–400)
RBC: 4.5 MIL/uL (ref 3.87–5.11)
RDW: 12.9 % (ref 11.5–15.5)
WBC: 5 10*3/uL (ref 4.0–10.5)
nRBC: 0 % (ref 0.0–0.2)

## 2021-12-13 LAB — COMPREHENSIVE METABOLIC PANEL
ALT: 14 U/L (ref 0–44)
AST: 16 U/L (ref 15–41)
Albumin: 4.5 g/dL (ref 3.5–5.0)
Alkaline Phosphatase: 50 U/L (ref 38–126)
Anion gap: 5 (ref 5–15)
BUN: 11 mg/dL (ref 6–20)
CO2: 24 mmol/L (ref 22–32)
Calcium: 9.4 mg/dL (ref 8.9–10.3)
Chloride: 109 mmol/L (ref 98–111)
Creatinine, Ser: 0.62 mg/dL (ref 0.44–1.00)
GFR, Estimated: >60 mL/min (ref >60)
Glucose, Bld: 80 mg/dL (ref 70–99)
Potassium: 4.1 mmol/L (ref 3.5–5.1)
Sodium: 138 mmol/L (ref 135–145)
Total Bilirubin: 2.3 mg/dL — ABNORMAL HIGH (ref 0.3–1.2)
Total Protein: 7.3 g/dL (ref 6.5–8.1)

## 2021-12-27 ENCOUNTER — Other Ambulatory Visit: Payer: Self-pay

## 2021-12-28 ENCOUNTER — Encounter: Payer: Self-pay | Admitting: Internal Medicine

## 2021-12-28 ENCOUNTER — Other Ambulatory Visit: Payer: Self-pay

## 2021-12-28 ENCOUNTER — Ambulatory Visit (INDEPENDENT_AMBULATORY_CARE_PROVIDER_SITE_OTHER): Payer: No Typology Code available for payment source | Admitting: Internal Medicine

## 2021-12-28 ENCOUNTER — Other Ambulatory Visit (HOSPITAL_COMMUNITY)
Admission: RE | Admit: 2021-12-28 | Discharge: 2021-12-28 | Disposition: A | Discharge status: Home / Self Care | Payer: No Typology Code available for payment source | Source: Ambulatory Visit | Attending: Internal Medicine | Admitting: Internal Medicine

## 2021-12-28 VITALS — BP 118/74 | HR 68 | Temp 98.0°F | Resp 12 | Ht 68.25 in | Wt 152.0 lb

## 2021-12-28 DIAGNOSIS — E559 Vitamin D deficiency, unspecified: Secondary | ICD-10-CM

## 2021-12-28 DIAGNOSIS — Z1329 Encounter for screening for other suspected endocrine disorder: Secondary | ICD-10-CM

## 2021-12-28 DIAGNOSIS — Z124 Encounter for screening for malignant neoplasm of cervix: Secondary | ICD-10-CM

## 2021-12-28 DIAGNOSIS — Z1389 Encounter for screening for other disorder: Secondary | ICD-10-CM

## 2021-12-28 DIAGNOSIS — L989 Disorder of the skin and subcutaneous tissue, unspecified: Secondary | ICD-10-CM

## 2021-12-28 DIAGNOSIS — Z13220 Encounter for screening for lipoid disorders: Secondary | ICD-10-CM | POA: Diagnosis not present

## 2021-12-28 DIAGNOSIS — Z0000 Encounter for general adult medical examination without abnormal findings: Secondary | ICD-10-CM

## 2021-12-28 DIAGNOSIS — Z1283 Encounter for screening for malignant neoplasm of skin: Secondary | ICD-10-CM

## 2021-12-28 LAB — TSH: TSH: 1.07 u[IU]/mL (ref 0.35–5.50)

## 2021-12-28 LAB — LIPID PANEL
Cholesterol: 173 mg/dL (ref 0–200)
HDL: 63.6 mg/dL (ref >39.00)
LDL Cholesterol: 96 mg/dL (ref 0–99)
NonHDL: 109.31
Total CHOL/HDL Ratio: 3
Triglycerides: 66 mg/dL (ref 0.0–149.0)
VLDL: 13.2 mg/dL (ref 0.0–40.0)

## 2021-12-28 LAB — VITAMIN D 25 HYDROXY (VIT D DEFICIENCY, FRACTURES): VITD: 31.84 ng/mL (ref 30.00–100.00)

## 2021-12-28 NOTE — Progress Notes (Signed)
Chief Complaint  Patient presents with   Annual Exam    Would like 3 yr pap done today since her doctor at [facility] has left and was recommended for her to have even though she had hysterectomy. Denies any pain today.    Annual  Skin lesion trunk rubbing bra right trunk and left refer dermatology  Uveitis/iritis cellcept helping pap  Review of Systems  Constitutional:  Negative for weight loss.  HENT:  Negative for hearing loss.   Eyes:  Negative for blurred vision.  Respiratory:  Negative for shortness of breath.   Cardiovascular:  Negative for chest pain.  Gastrointestinal:  Negative for abdominal pain and blood in stool.  Genitourinary:  Negative for dysuria.  Musculoskeletal:  Negative for falls and joint pain.  Skin:  Negative for rash.  Neurological:  Negative for headaches.  Psychiatric/Behavioral:  Negative for depression.   Past Medical History:  Diagnosis Date   Adenocarcinoma in situ (AIS) of uterine cervix    s/p total hysteretectomy including uterus, cervix fallopian tubes 05/2017 Dr. Bonney Aid ovaries intact    Cancer [facility])    CMV (cytomegalovirus infection) status positive (HCC)    Complication of anesthesia    COVID-19    07/2020   Family history of adverse reaction to anesthesia    SISTER-HARD TIME WAKING UP   Family history of colorectal cancer    Family history of liver cancer    HSV-1 (herpes simplex virus 1) infection    Medical history non-contributory    PONV (postoperative nausea and vomiting) 09/2016   X 1 AFTER BTL   Positive Lyme disease serology    Past Surgical History:  Procedure Laterality Date   ABDOMINAL HYSTERECTOMY     05/2017 Dr. Bonney Aid    APPENDECTOMY     2010   CERVICAL CONE BIOPSY  03/05/2017   [facility], A[facility]   CERVICAL CONIZATION W/BX N/A 03/05/2017   Procedure: CONIZATION CERVIX WITH BIOPSY;  Surgeon: Vena Austria, MD;  Location: [facility] ORS;  Service: Gynecology;  Laterality: N/A;   COLONOSCOPY WITH PROPOFOL N/A 10/05/2019    Procedure: COLONOSCOPY WITH PROPOFOL;  Surgeon: Toledo, Boykin Nearing, MD;  Location: [facility] END[facility];  Service: Gastroenterology;  Laterality: N/A;   CYSTOSCOPY N/A 05/07/2017   Procedure: CYSTOSCOPY;  Surgeon: Vena Austria, MD;  Location: [facility] ORS;  Service: Gynecology;  Laterality: N/A;   LAPAROSCOPIC HYSTERECTOMY Bilateral 05/07/2017   Procedure: HYSTERECTOMY TOTAL LAPAROSCOPIC BILATERAL SALPINGECTOMY;  Surgeon: Vena Austria, MD;  Location: [facility] ORS;  Service: Gynecology;  Laterality: Bilateral;   LAPAROSCOPIC TUBAL LIGATION Bilateral 09/25/2016   Procedure: LAPAROSCOPIC TUBAL LIGATION;  Surgeon: Vena Austria, MD;  Location: [facility] OR[facility];  Service: Gynecology;  Laterality: Bilateral;   TONSILLECTOMY     TUBAL LIGATION  09/2016   WISDOM TOOTH EXTRACTION     Family History  Problem Relation Age of Onset   Cancer Mother        dx 05/2021 age 34   Hyperlipidemia Mother    Hypertension Mother    Breast cancer Mother 82   Rectal cancer Father 26       metastasized to lung   Pulmonary embolism Father    Healthy Daughter    Healthy Daughter    COPD Maternal Aunt    Liver cancer Maternal Aunt 18   Colon cancer Paternal Great-grandfather    Colon cancer Other        paternal great-uncle   Colon cancer Other        paternal great-uncle  Colon cancer Other        paternal first cousin once removed   Social History   Socioeconomic History   Marital status: Married    Spouse name: Not on file   Number of children: Not on file   Years of education: Not on file   Highest education level: Not on file  Occupational History   Not on file  Tobacco Use   Smoking status: Never   Smokeless tobacco: Never  Vaping Use   Vaping Use: Never used  Substance and Sexual Activity   Alcohol use: No   Drug use: No   Sexual activity: Yes    Birth control/protection: Surgical    Comment: BTL  Other Topics Concern   Not on file  Social History Narrative   Married 2 kids girls     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[facility] , 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    No drinking smoking    No guns, wears seatbelts    Safe in relationship    Social Determinants of Health   Financial Resource Strain: Not on file  Food Insecurity: Not on file  Transportation Needs: Not on file  Physical Activity: Not on file  Stress: Not on file  Social Connections: Not on file  Intimate Partner Violence: Not on file   Current Meds  Medication Sig   mycophenolate (CELLCEPT) 500 MG tablet Take 2 tablets (1,000 mg total) by mouth 2 times daily. Taken by mouth as directed   mycophenolate (CELLCEPT) 500 MG tablet Take 2 tablets (1,000 mg total) by mouth 2 times daily. Taken by mouth as directed   mycophenolate (CELLCEPT) 500 MG tablet Take 2 tablets (1,000 mg total) by mouth 2 times daily. Taken by mouth as directed   prednisoLONE acetate (PRED FORTE) 1 % ophthalmic suspension SMARTSIG:In Eye(s)   prednisoLONE acetate (PRED FORTE) 1 % ophthalmic suspension Place 1 drop into both eyes 4 times daily.   Allergies  Allergen Reactions   Sulfamethoxazole-Trimethoprim Rash   Recent Results (from the past 2160 hour(s))  Comprehensive metabolic panel     Status: Abnormal   Collection Time: 11/01/21  8:46 AM  Result Value Ref Range   Sodium 136 135 - 145 mmol/L   Potassium 3.9 3.5 - 5.1 mmol/L   Chloride 105 98 - 111 mmol/L   CO2 26 22 - 32 mmol/L   Glucose, Bld 86 70 - 99 mg/dL    Comment: Glucose reference range applies only to samples taken after fasting for at least 8 hours.   BUN 10 6 - 20 mg/dL   Creatinine, Ser 0.70 0.44 - 1.00 mg/dL   Calcium 9.1 8.9 - 10.3 mg/dL   Total Protein 6.9 6.5 - 8.1 g/dL   Albumin 4.3 3.5 - 5.0 g/dL   AST 15 15 - 41 U/L   ALT 12 0 - 44 U/L   Alkaline Phosphatase 63 38 - 126 U/L   Total Bilirubin 1.3 (H) 0.3 - 1.2 mg/dL   GFR, Estimated >60 >60 mL/min    Comment: (NOTE) Calculated using the CKD-EPI Creatinine Equation (2021)    Anion gap 5 5 - 15    Comment: Performed at [facility], [address] [place], [place] [address]  CBC with Differential/Platelet     Status: Abnormal   Collection Time: 11/01/21  8:46 AM  Result Value Ref Range   WBC 3.9 (L) 4.0 - 10.5 K/uL   RBC 4.69 3.87 - 5.11 MIL/uL   Hemoglobin 13.8 12.0 - 15.0  g/dL   HCT 41.5 36.0 - 46.0 %   MCV 88.5 80.0 - 100.0 fL   MCH 29.4 26.0 - 34.0 pg   MCHC 33.3 30.0 - 36.0 g/dL   RDW 12.0 11.5 - 15.5 %   Platelets 168 150 - 400 K/uL   nRBC 0.0 0.0 - 0.2 %   Neutrophils Relative % 45 %   Neutro Abs 1.8 1.7 - 7.7 K/uL   Lymphocytes Relative 47 %   Lymphs Abs 1.9 0.7 - 4.0 K/uL   Monocytes Relative 6 %   Monocytes Absolute 0.2 0.1 - 1.0 K/uL   Eosinophils Relative 1 %   Eosinophils Absolute 0.0 0.0 - 0.5 K/uL   Basophils Relative 1 %   Basophils Absolute 0.0 0.0 - 0.1 K/uL   Immature Granulocytes 0 %   Abs Immature Granulocytes 0.01 0.00 - 0.07 K/uL    Comment: Performed at [facility], [address] [place], [place] [address]  CBC with Differential/Platelet     Status: None   Collection Time: 12/13/21  7:46 AM  Result Value Ref Range   WBC 5.0 4.0 - 10.5 K/uL   RBC 4.50 3.87 - 5.11 MIL/uL   Hemoglobin 13.1 12.0 - 15.0 g/dL   HCT 39.7 36.0 - 46.0 %   MCV 88.2 80.0 - 100.0 fL   MCH 29.1 26.0 - 34.0 pg   MCHC 33.0 30.0 - 36.0 g/dL   RDW 12.9 11.5 - 15.5 %   Platelets 193 150 - 400 K/uL   nRBC 0.0 0.0 - 0.2 %   Neutrophils Relative % 55 %   Neutro Abs 2.7 1.7 - 7.7 K/uL   Lymphocytes Relative 35 %   Lymphs Abs 1.8 0.7 - 4.0 K/uL   Monocytes Relative 8 %   Monocytes Absolute 0.4 0.1 - 1.0 K/uL   Eosinophils Relative 1 %   Eosinophils Absolute 0.1 0.0 - 0.5 K/uL   Basophils Relative 1 %   Basophils Absolute 0.0 0.0 - 0.1 K/uL   Immature Granulocytes 0 %   Abs Immature Granulocytes 0.01 0.00 - 0.07 K/uL    Comment: Performed at [facility], [address] [place], [place] [address]  Comprehensive metabolic panel     Status: Abnormal   Collection Time:  12/13/21  7:46 AM  Result Value Ref Range   Sodium 138 135 - 145 mmol/L   Potassium 4.1 3.5 - 5.1 mmol/L   Chloride 109 98 - 111 mmol/L   CO2 24 22 - 32 mmol/L   Glucose, Bld 80 70 - 99 mg/dL    Comment: Glucose reference range applies only to samples taken after fasting for at least 8 hours.   BUN 11 6 - 20 mg/dL   Creatinine, Ser 0.62 0.44 - 1.00 mg/dL   Calcium 9.4 8.9 - 10.3 mg/dL   Total Protein 7.3 6.5 - 8.1 g/dL   Albumin 4.5 3.5 - 5.0 g/dL   AST 16 15 - 41 U/L   ALT 14 0 - 44 U/L   Alkaline Phosphatase 50 38 - 126 U/L   Total Bilirubin 2.3 (H) 0.3 - 1.2 mg/dL   GFR, Estimated >60 >60 mL/min    Comment: (NOTE) Calculated using the CKD-EPI Creatinine Equation (2021)    Anion gap 5 5 - 15    Comment: Performed at [facility], [address] [place], [place] [address]   Objective  Body mass index is 22.94 kg/m. Wt Readings from Last 3 Encounters:  12/28/21 152  lb (68.9 kg)  02/14/21 153 lb (69.4 kg)  01/10/21 154 lb (69.9 kg)   Temp Readings from Last 3 Encounters:  12/28/21 98 F (36.7 C) (Oral)  01/10/21 97.7 F (36.5 C) (Oral)  12/27/20 97.6 F (36.4 C) (Oral)   BP Readings from Last 3 Encounters:  12/28/21 118/74  02/14/21 104/70  01/10/21 121/79   Pulse Readings from Last 3 Encounters:  12/28/21 68  02/14/21 74  01/10/21 80    Physical Exam Vitals and nursing note reviewed.  Constitutional:      Appearance: Normal appearance. She is well-developed and well-groomed.  HENT:     Head: Normocephalic and atraumatic.  Eyes:     Conjunctiva/sclera: Conjunctivae normal.     Pupils: Pupils are equal, round, and reactive to light.  Cardiovascular:     Rate and Rhythm: Normal rate and regular rhythm.     Heart sounds: Normal heart sounds. No murmur heard. Pulmonary:     Effort: Pulmonary effort is normal.     Breath sounds: Normal breath sounds.  Chest:     Chest wall: No mass.  Breasts:    Breasts are symmetrical.     Right: Normal.      Left: Normal.  Abdominal:     General: Abdomen is flat. Bowel sounds are normal.     Tenderness: There is no abdominal tenderness.  Genitourinary:    General: Normal vulva.     Pubic Area: No rash.      Labia:        Right: No rash.        Left: No rash.      Vagina: Normal.     Uterus: Absent.      Adnexa: Right adnexa normal and left adnexa normal.     Comments: No cervix both ovaries present Musculoskeletal:        General: No tenderness.  Lymphadenopathy:     Upper Body:     Right upper body: No axillary adenopathy.     Left upper body: No axillary adenopathy.  Skin:    General: Skin is warm and dry.  Neurological:     General: No focal deficit present.     Mental Status: She is alert and oriented to person, place, and time. Mental status is at baseline.     Cranial Nerves: Cranial nerves 2-12 are intact.     Motor: Motor function is intact.     Coordination: Coordination is intact.     Gait: Gait is intact.  Psychiatric:        Attention and Perception: Attention and perception normal.        Mood and Affect: Mood and affect normal.        Speech: Speech normal.        Behavior: Behavior normal. Behavior is cooperative.        Thought Content: Thought content normal.        Cognition and Memory: Cognition and memory normal.        Judgment: Judgment normal.    Assessment  Plan  Annual physical exam See below Routine cervical smear - Plan: Cytology - PAP( [place])   Skin cancer screening - Plan: Ambulatory referral to Dermatology Skin lesion - Plan: Ambulatory referral to Dermatology   Had flu shot utd Tdap and hep b titer check with employee health  -Tdap 2018 had ? 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  At this time, it is unknown if this variant is associated with increased cancer risk or if this is a normal finding, but most variants such as this get reclassified to being inconsequential. It should not be used to make medical management decisions. With time, we suspect the lab will determine the significance of this variant, if any. If we do learn more about it, we will try to contact Ms. Hrdlicka to discuss it further. However, it is important to stay in touch with [place] periodically and keep the address and phone number up to date.   We also discussed that upon review of available records, we were unable to find results indicating that Ms. Swofford father had genetic testing for hereditary cancer risk. It appears that genetic testing was performed on his tumor, which revealed a mutation in the KRAS gene. While KRAS mutations identified in tumor testing may provide insight into treatment options for that cancer, it does not indicate a hereditary risk for colorectal cancer or warrant additional germline testing in the patient or relatives.     Provider: Dr. Olivia Mackie McLean-Scocuzza-Internal Medicine

## 2021-12-29 LAB — URINALYSIS, ROUTINE W REFLEX MICROSCOPIC
Bilirubin Urine: NEGATIVE
Glucose, UA: NEGATIVE
Hgb urine dipstick: NEGATIVE
Ketones, ur: NEGATIVE
Leukocytes,Ua: NEGATIVE
Nitrite: NEGATIVE
Protein, ur: NEGATIVE
Specific Gravity, Urine: 1.008 (ref 1.001–1.035)
pH: 6 (ref 5.0–8.0)

## 2021-12-29 LAB — CYTOLOGY - PAP
Comment: NEGATIVE
Diagnosis: NEGATIVE
High risk HPV: NEGATIVE

## 2022-01-24 ENCOUNTER — Other Ambulatory Visit
Admission: RE | Admit: 2022-01-24 | Discharge: 2022-01-24 | Disposition: A | Discharge status: Home / Self Care | Payer: No Typology Code available for payment source | Attending: Ophthalmology | Admitting: Ophthalmology

## 2022-01-24 DIAGNOSIS — H30033 Focal chorioretinal inflammation, peripheral, bilateral: Secondary | ICD-10-CM | POA: Insufficient documentation

## 2022-01-24 DIAGNOSIS — Z5181 Encounter for therapeutic drug level monitoring: Secondary | ICD-10-CM | POA: Insufficient documentation

## 2022-01-24 LAB — CBC WITH DIFFERENTIAL/PLATELET
Abs Immature Granulocytes: 0.01 10*3/uL (ref 0.00–0.07)
Basophils Absolute: 0 10*3/uL (ref 0.0–0.1)
Basophils Relative: 1 %
Eosinophils Absolute: 0.1 10*3/uL (ref 0.0–0.5)
Eosinophils Relative: 2 %
HCT: 40.6 % (ref 36.0–46.0)
Hemoglobin: 13.1 g/dL (ref 12.0–15.0)
Immature Granulocytes: 0 %
Lymphocytes Relative: 45 %
Lymphs Abs: 2 10*3/uL (ref 0.7–4.0)
MCH: 28.5 pg (ref 26.0–34.0)
MCHC: 32.3 g/dL (ref 30.0–36.0)
MCV: 88.5 fL (ref 80.0–100.0)
Monocytes Absolute: 0.4 10*3/uL (ref 0.1–1.0)
Monocytes Relative: 8 %
Neutro Abs: 1.9 10*3/uL (ref 1.7–7.7)
Neutrophils Relative %: 44 %
Platelets: 199 10*3/uL (ref 150–400)
RBC: 4.59 MIL/uL (ref 3.87–5.11)
RDW: 12.7 % (ref 11.5–15.5)
WBC: 4.4 10*3/uL (ref 4.0–10.5)
nRBC: 0 % (ref 0.0–0.2)

## 2022-01-24 LAB — COMPREHENSIVE METABOLIC PANEL
ALT: 14 U/L (ref 0–44)
AST: 14 U/L — ABNORMAL LOW (ref 15–41)
Albumin: 4.3 g/dL (ref 3.5–5.0)
Alkaline Phosphatase: 50 U/L (ref 38–126)
Anion gap: 5 (ref 5–15)
BUN: 11 mg/dL (ref 6–20)
CO2: 26 mmol/L (ref 22–32)
Calcium: 9.3 mg/dL (ref 8.9–10.3)
Chloride: 107 mmol/L (ref 98–111)
Creatinine, Ser: 0.86 mg/dL (ref 0.44–1.00)
GFR, Estimated: >60 mL/min (ref >60)
Glucose, Bld: 86 mg/dL (ref 70–99)
Potassium: 3.8 mmol/L (ref 3.5–5.1)
Sodium: 138 mmol/L (ref 135–145)
Total Bilirubin: 2.1 mg/dL — ABNORMAL HIGH (ref 0.3–1.2)
Total Protein: 6.9 g/dL (ref 6.5–8.1)

## 2022-01-29 ENCOUNTER — Other Ambulatory Visit: Payer: Self-pay

## 2022-02-06 ENCOUNTER — Other Ambulatory Visit: Payer: Self-pay

## 2022-02-06 MED ORDER — MYCOPHENOLATE MOFETIL 500 MG PO TABS
ORAL_TABLET | ORAL | 5 refills | Status: AC
  Filled 2022-02-06: qty 360, 90d supply, fill #0

## 2022-02-20 ENCOUNTER — Ambulatory Visit (INDEPENDENT_AMBULATORY_CARE_PROVIDER_SITE_OTHER): Payer: No Typology Code available for payment source | Admitting: Internal Medicine

## 2022-02-20 ENCOUNTER — Encounter: Payer: Self-pay | Admitting: Internal Medicine

## 2022-02-20 VITALS — BP 104/70 | HR 62 | Temp 97.8°F | Resp 14 | Ht 68.25 in | Wt 155.4 lb

## 2022-02-20 DIAGNOSIS — N644 Mastodynia: Secondary | ICD-10-CM

## 2022-02-20 NOTE — Patient Instructions (Addendum)
Fax # [phone]  ? ?Ultrasound  ?Consider mammogram diagnostic left breast at [facility]  ? ?Breast Tenderness ?Breast tenderness is a common problem for women of all ages, but may also occur in men. Breast tenderness may range from mild discomfort to severe pain. In women, the pain usually comes and goes with the menstrual cycle, but it can also be constant. ?Breast tenderness has many possible causes, including hormone changes, infections, and taking certain medicines. You may have tests, such as a mammogram or an ultrasound, to check for any unusual findings. Having breast tenderness usually does not mean that you have breast cancer. ?Follow these instructions at home: ?Managing pain and discomfort ? ?If directed, put ice to the painful area. To do this: ?Put ice in a plastic bag. ?Place a towel between your skin and the bag. ?Leave the ice on for 20 minutes, 2-3 times a day. ?Wear a supportive bra, especially during exercise. You may also want to wear a supportive bra while sleeping if your breasts are very tender. ?Medicines ?Take over-the-counter and prescription medicines only as told by your health care provider. If the cause of your pain is infection, you may be prescribed an antibiotic medicine. ?If you were prescribed an antibiotic, take it as told by your health care provider. Do not stop taking the antibiotic even if you start to feel better. ?Eating and drinking ?Your health care provider may recommend that you lessen the amount of fat in your diet. You can do this by: ?Limiting fried foods. ?Cooking foods using methods such as baking, boiling, grilling, and broiling. ?Decrease the amount of caffeine in your diet. Instead, drink more water and choose caffeine-free drinks. ?General instructions ? ?Keep a log of the days and times when your breasts are most tender. ?Ask your health care provider how to do breast exams at home. This will help you notice if you have an unusual growth or lump. ?Keep all  follow-up visits as told by your health care provider. This is important. ?Contact a health care provider if: ?Any part of your breast is hard, red, and hot to the touch. This may be a sign of infection. ?You are a woman and: ?Not breastfeeding and you have fluid, especially blood or pus, coming out of your nipples. ?Have a new or painful lump in your breast that remains after your menstrual period ends. ?You have a fever. ?Your pain does not improve or it gets worse. ?Your pain is interfering with your daily activities. ?Summary ?Breast tenderness may range from mild discomfort to severe pain. ?Breast tenderness has many possible causes, including hormone changes, infections, and taking certain medicines. ?It can be treated with ice, wearing a supportive bra, and medicines. ?Make changes to your diet if told to by your health care provider. ?This information is not intended to replace advice given to you by your health care provider. Make sure you discuss any questions you have with your health care provider. ?Document Revised: 03/16/2019 Document Reviewed: 03/16/2019 ?Elsevier Patient Education ? [place]. ? ?

## 2022-02-20 NOTE — Progress Notes (Signed)
Chief Complaint  ?Patient presents with  ? Breast Pain  ?  Left breast under nipple area X 1 week last week was red and swollen in the area no nipple DC.  ? ?F/u  ?1. Left breast soreness under nipple and left breast was more swollen than right saw Dr. Bary Castilla surgery 02/16/22 and [place] sch 3:45 pm today s/p hysterectomy pain 7/10 burning left breast and notes around time when cycle would be at times the pain so is recurrent  ? ? ?Review of Systems  ?Constitutional:  Negative for weight loss.  ?HENT:  Negative for hearing loss.   ?Eyes:  Negative for blurred vision.  ?Respiratory:  Negative for shortness of breath.   ?Cardiovascular:  Negative for chest pain.  ?Gastrointestinal:  Negative for abdominal pain and blood in stool.  ?Genitourinary:  Negative for dysuria.  ?Musculoskeletal:  Negative for falls and joint pain.  ?Skin:  Negative for rash.  ?Neurological:  Negative for headaches.  ?Psychiatric/Behavioral:  Negative for depression.   ?Past Medical History:  ?Diagnosis Date  ? Adenocarcinoma in situ (AIS) of uterine cervix   ? s/p total hysteretectomy including uterus, cervix fallopian tubes 05/2017 Dr. Georgianne Fick ovaries intact   ? Cancer [facility])   ? CMV (cytomegalovirus infection) status positive ([place])   ? Complication of anesthesia   ? COVID-19   ? 07/2020  ? Family history of adverse reaction to anesthesia   ? SISTER-HARD TIME WAKING UP  ? Family history of colorectal cancer   ? Family history of liver cancer   ? HSV-1 (herpes simplex virus 1) infection   ? Medical history non-contributory   ? PONV (postoperative nausea and vomiting) 09/2016  ? X 1 AFTER BTL  ? Positive Lyme disease serology   ? ?Past Surgical History:  ?Procedure Laterality Date  ? ABDOMINAL HYSTERECTOMY    ? 05/2017 Dr. Georgianne Fick   ? APPENDECTOMY    ? 2010  ? CERVICAL CONE BIOPSY  03/05/2017  ? [facility], [place]  ? CERVICAL CONIZATION W/BX N/A 03/05/2017  ? Procedure: CONIZATION CERVIX WITH BIOPSY;  Surgeon: Malachy Mood, MD;  Location: [facility] ORS;   Service: Gynecology;  Laterality: N/A;  ? COLONOSCOPY WITH PROPOFOL N/A 10/05/2019  ? Procedure: COLONOSCOPY WITH PROPOFOL;  Surgeon: Toledo, Benay Pike, MD;  Location: [facility] ENDOSCOPY;  Service: Gastroenterology;  Laterality: N/A;  ? CYSTOSCOPY N/A 05/07/2017  ? Procedure: CYSTOSCOPY;  Surgeon: Malachy Mood, MD;  Location: [facility] ORS;  Service: Gynecology;  Laterality: N/A;  ? LAPAROSCOPIC HYSTERECTOMY Bilateral 05/07/2017  ? Procedure: HYSTERECTOMY TOTAL LAPAROSCOPIC BILATERAL SALPINGECTOMY;  Surgeon: Malachy Mood, MD;  Location: [facility] ORS;  Service: Gynecology;  Laterality: Bilateral;  ? LAPAROSCOPIC TUBAL LIGATION Bilateral 09/25/2016  ? Procedure: LAPAROSCOPIC TUBAL LIGATION;  Surgeon: Malachy Mood, MD;  Location: [facility] OR[facility];  Service: Gynecology;  Laterality: Bilateral;  ? TONSILLECTOMY    ? TUBAL LIGATION  09/2016  ? WISDOM TOOTH EXTRACTION    ? ?Family History  ?Problem Relation Age of Onset  ? Cancer Mother   ?     dx 05/2021 age 20  ? Hyperlipidemia Mother   ? Hypertension Mother   ? Breast cancer Mother 15  ? Rectal cancer Father 45  ?     metastasized to lung  ? Pulmonary embolism Father   ? Healthy Daughter   ? Healthy Daughter   ? COPD Maternal Aunt   ? Liver cancer Maternal Aunt 61  ? Colon cancer Paternal Great-grandfather   ? Colon cancer Other   ?  paternal great-uncle  ? Colon cancer Other   ?     paternal great-uncle  ? Colon cancer Other   ?     paternal first cousin once removed  ? ?Social History  ? ?Socioeconomic History  ? Marital status: Married  ?  Spouse name: Not on file  ? Number of children: Not on file  ? Years of education: Not on file  ? Highest education level: Not on file  ?Occupational History  ? Not on file  ?Tobacco Use  ? Smoking status: Never  ? Smokeless tobacco: Never  ?Vaping Use  ? Vaping Use: Never used  ?Substance and Sexual Activity  ? Alcohol use: No  ? Drug use: No  ? Sexual activity: Yes  ?  Birth control/protection: Surgical  ?  Comment: BTL  ?Other Topics  Concern  ? Not on file  ?Social History Narrative  ? Married 2 kids girls   ? 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 , 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   ? No drinking smoking   ? No guns, wears seatbelts   ? Safe in relationship   ? ?Social Determinants of Health  ? ?Financial Resource Strain: Not on file  ?Food Insecurity: Not on file  ?Transportation Needs: Not on file  ?Physical Activity: Not on file  ?Stress: Not on file  ?Social Connections: Not on file  ?Intimate Partner Violence: Not on file  ? ?Current Meds  ?Medication Sig  ? mycophenolate (CELLCEPT) 500 MG tablet Take 2 tablets (1,000 mg total) by mouth 2 times daily. Taken by mouth as directed  ? mycophenolate (CELLCEPT) 500 MG tablet Take 2 tablets (1,000 mg total) by mouth 2 times daily. Taken by mouth as directed  ? mycophenolate (CELLCEPT) 500 MG tablet Take 2 tablets (1,000 mg total) by mouth 2 times daily. Taken by mouth as directed  ? mycophenolate (CELLCEPT) 500 MG tablet Take 2 tablets (1,000 mg total) by mouth 2 times daily. Taken by mouth as directed  ? prednisoLONE acetate (PRED FORTE) 1 % ophthalmic suspension SMARTSIG:In Eye(s)  ? prednisoLONE acetate (PRED FORTE) 1 % ophthalmic suspension Place 1 drop into both eyes 4 times daily.  ? ?Allergies  ?Allergen Reactions  ? Sulfamethoxazole-Trimethoprim Rash  ? ?Recent Results (from the past 2160 hour(s))  ?CBC with Differential/Platelet     Status: None  ? Collection Time: 12/13/21  7:46 AM  ?Result Value Ref Range  ? WBC 5.0 4.0 - 10.5 K/uL  ? RBC 4.50 3.87 - 5.11 MIL/uL  ? Hemoglobin 13.1 12.0 - 15.0 g/dL  ? HCT 39.7 36.0 - 46.0 %  ? MCV 88.2 80.0 - 100.0 fL  ? MCH 29.1 26.0 - 34.0 pg  ? MCHC 33.0 30.0 - 36.0 g/dL  ? RDW 12.9 11.5 - 15.5 %  ? Platelets 193 150 - 400 K/uL  ? nRBC 0.0 0.0 - 0.2 %  ? Neutrophils Relative % 55 %  ? Neutro Abs 2.7 1.7 - 7.7 K/uL  ? Lymphocytes Relative 35 %  ? Lymphs Abs 1.8 0.7 - 4.0 K/uL  ? Monocytes Relative 8 %  ? Monocytes Absolute 0.4 0.1 - 1.0 K/uL  ? Eosinophils Relative 1 %  ?  Eosinophils Absolute 0.1 0.0 - 0.5 K/uL  ? Basophils Relative 1 %  ? Basophils Absolute 0.0 0.0 - 0.1 K/uL  ? Immature Granulocytes 0 %  ? Abs Immature Granulocytes 0.01 0.00 - 0.07 K/uL  ?  Comment: Performed at [facility], [address], [place], [place]  [address]  ?Comprehensive metabolic panel     Status: Abnormal  ? Collection Time: 12/13/21  7:46 AM  ?Result Value Ref Range  ? Sodium 138 135 - 145 mmol/L  ? Potassium 4.1 3.5 - 5.1 mmol/L  ? Chloride 109 98 - 111 mmol/L  ? CO2 24 22 - 32 mmol/L  ? Glucose, Bld 80 70 - 99 mg/dL  ?  Comment: Glucose reference range applies only to samples taken after fasting for at least 8 hours.  ? BUN 11 6 - 20 mg/dL  ? Creatinine, Ser 0.62 0.44 - 1.00 mg/dL  ? Calcium 9.4 8.9 - 10.3 mg/dL  ? Total Protein 7.3 6.5 - 8.1 g/dL  ? Albumin 4.5 3.5 - 5.0 g/dL  ? AST 16 15 - 41 U/L  ? ALT 14 0 - 44 U/L  ? Alkaline Phosphatase 50 38 - 126 U/L  ? Total Bilirubin 2.3 (H) 0.3 - 1.2 mg/dL  ? GFR, Estimated >60 >60 mL/min  ?  Comment: (NOTE) ?Calculated using the CKD-EPI Creatinine Equation (2021) ?  ? Anion gap 5 5 - 15  ?  Comment: Performed at [facility], [address], [place], [place] [address]  ?Cytology - PAP( [place])     Status: None  ? Collection Time: 12/28/21 11:00 AM  ?Result Value Ref Range  ? High risk HPV Negative   ? Adequacy Satisfactory for evaluation.   ? Diagnosis    ?  - Negative for intraepithelial lesion or malignancy (NILM)  ? Comment Normal Reference Range HPV - Negative   ?Lipid panel     Status: None  ? Collection Time: 12/28/21 11:22 AM  ?Result Value Ref Range  ? Cholesterol 173 0 - 200 mg/dL  ?  Comment: ATP III Classification       Desirable:  < 200 mg/dL               Borderline High:  200 - 239 mg/dL          High:  > = 240 mg/dL  ? Triglycerides 66.0 0.0 - 149.0 mg/dL  ?  Comment: Normal:  <150 mg/dLBorderline High:  150 - 199 mg/dL  ? HDL 63.60 >39.00 mg/dL  ? VLDL 13.2 0.0 - 40.0 mg/dL  ? LDL Cholesterol 96 0 - 99 mg/dL  ?  Total CHOL/HDL Ratio 3   ?  Comment:                Men          Women1/2 Average Risk     3.4          3.3Average Risk          5.0          4.42X Average Risk          9.6          7.13X Average Risk

## 2022-02-21 ENCOUNTER — Encounter: Payer: Self-pay | Admitting: Internal Medicine

## 2022-03-07 ENCOUNTER — Other Ambulatory Visit
Admission: RE | Admit: 2022-03-07 | Discharge: 2022-03-07 | Disposition: A | Discharge status: Home / Self Care | Payer: No Typology Code available for payment source | Attending: Ophthalmology | Admitting: Ophthalmology

## 2022-03-07 DIAGNOSIS — H30033 Focal chorioretinal inflammation, peripheral, bilateral: Secondary | ICD-10-CM | POA: Diagnosis not present

## 2022-03-07 DIAGNOSIS — Z5181 Encounter for therapeutic drug level monitoring: Secondary | ICD-10-CM | POA: Insufficient documentation

## 2022-03-07 LAB — COMPREHENSIVE METABOLIC PANEL
ALT: 16 U/L (ref 0–44)
AST: 16 U/L (ref 15–41)
Albumin: 4.4 g/dL (ref 3.5–5.0)
Alkaline Phosphatase: 45 U/L (ref 38–126)
Anion gap: 8 (ref 5–15)
BUN: 13 mg/dL (ref 6–20)
CO2: 25 mmol/L (ref 22–32)
Calcium: 9.8 mg/dL (ref 8.9–10.3)
Chloride: 105 mmol/L (ref 98–111)
Creatinine, Ser: 0.79 mg/dL (ref 0.44–1.00)
GFR, Estimated: >60 mL/min (ref >60)
Glucose, Bld: 94 mg/dL (ref 70–99)
Potassium: 3.7 mmol/L (ref 3.5–5.1)
Sodium: 138 mmol/L (ref 135–145)
Total Bilirubin: 1.4 mg/dL — ABNORMAL HIGH (ref 0.3–1.2)
Total Protein: 7.1 g/dL (ref 6.5–8.1)

## 2022-03-07 LAB — CBC WITH DIFFERENTIAL/PLATELET
Abs Immature Granulocytes: 0.01 10*3/uL (ref 0.00–0.07)
Basophils Absolute: 0 10*3/uL (ref 0.0–0.1)
Basophils Relative: 0 %
Eosinophils Absolute: 0.1 10*3/uL (ref 0.0–0.5)
Eosinophils Relative: 1 %
HCT: 41.8 % (ref 36.0–46.0)
Hemoglobin: 13.5 g/dL (ref 12.0–15.0)
Immature Granulocytes: 0 %
Lymphocytes Relative: 41 %
Lymphs Abs: 2.2 10*3/uL (ref 0.7–4.0)
MCH: 28.4 pg (ref 26.0–34.0)
MCHC: 32.3 g/dL (ref 30.0–36.0)
MCV: 87.8 fL (ref 80.0–100.0)
Monocytes Absolute: 0.4 10*3/uL (ref 0.1–1.0)
Monocytes Relative: 8 %
Neutro Abs: 2.6 10*3/uL (ref 1.7–7.7)
Neutrophils Relative %: 50 %
Platelets: 191 10*3/uL (ref 150–400)
RBC: 4.76 MIL/uL (ref 3.87–5.11)
RDW: 12.5 % (ref 11.5–15.5)
WBC: 5.2 10*3/uL (ref 4.0–10.5)
nRBC: 0 % (ref 0.0–0.2)

## 2022-04-27 ENCOUNTER — Other Ambulatory Visit: Payer: Self-pay

## 2022-05-03 ENCOUNTER — Other Ambulatory Visit
Admission: RE | Admit: 2022-05-03 | Discharge: 2022-05-03 | Disposition: A | Discharge status: Home / Self Care | Payer: No Typology Code available for payment source | Attending: Ophthalmology | Admitting: Ophthalmology

## 2022-05-03 DIAGNOSIS — H30033 Focal chorioretinal inflammation, peripheral, bilateral: Secondary | ICD-10-CM | POA: Insufficient documentation

## 2022-05-03 LAB — CBC WITH DIFFERENTIAL/PLATELET
Abs Immature Granulocytes: 0.01 10*3/uL (ref 0.00–0.07)
Basophils Absolute: 0 10*3/uL (ref 0.0–0.1)
Basophils Relative: 0 %
Eosinophils Absolute: 0.1 10*3/uL (ref 0.0–0.5)
Eosinophils Relative: 1 %
HCT: 41.6 % (ref 36.0–46.0)
Hemoglobin: 13.6 g/dL (ref 12.0–15.0)
Immature Granulocytes: 0 %
Lymphocytes Relative: 35 %
Lymphs Abs: 1.8 10*3/uL (ref 0.7–4.0)
MCH: 28.6 pg (ref 26.0–34.0)
MCHC: 32.7 g/dL (ref 30.0–36.0)
MCV: 87.4 fL (ref 80.0–100.0)
Monocytes Absolute: 0.4 10*3/uL (ref 0.1–1.0)
Monocytes Relative: 8 %
Neutro Abs: 2.8 10*3/uL (ref 1.7–7.7)
Neutrophils Relative %: 56 %
Platelets: 182 10*3/uL (ref 150–400)
RBC: 4.76 MIL/uL (ref 3.87–5.11)
RDW: 12.3 % (ref 11.5–15.5)
WBC: 5.1 10*3/uL (ref 4.0–10.5)
nRBC: 0 % (ref 0.0–0.2)

## 2022-05-03 LAB — COMPREHENSIVE METABOLIC PANEL
ALT: 15 U/L (ref 0–44)
AST: 15 U/L (ref 15–41)
Albumin: 4.5 g/dL (ref 3.5–5.0)
Alkaline Phosphatase: 51 U/L (ref 38–126)
Anion gap: 5 (ref 5–15)
BUN: 12 mg/dL (ref 6–20)
CO2: 23 mmol/L (ref 22–32)
Calcium: 9.9 mg/dL (ref 8.9–10.3)
Chloride: 112 mmol/L — ABNORMAL HIGH (ref 98–111)
Creatinine, Ser: 0.68 mg/dL (ref 0.44–1.00)
GFR, Estimated: >60 mL/min (ref >60)
Glucose, Bld: 89 mg/dL (ref 70–99)
Potassium: 3.8 mmol/L (ref 3.5–5.1)
Sodium: 140 mmol/L (ref 135–145)
Total Bilirubin: 1.8 mg/dL — ABNORMAL HIGH (ref 0.3–1.2)
Total Protein: 7.1 g/dL (ref 6.5–8.1)

## 2022-06-09 IMAGING — US US ABDOMEN COMPLETE
1 series · 14 of 25 positions shown · non-contrast
Comparison: None.

CLINICAL DATA: Elevated liver enzymes

EXAM:
ABDOMEN ULTRASOUND COMPLETE

[Series 1: us abdomen complete · 0.19mm/px · 14 of 99 slices shown]
[im 1/99]
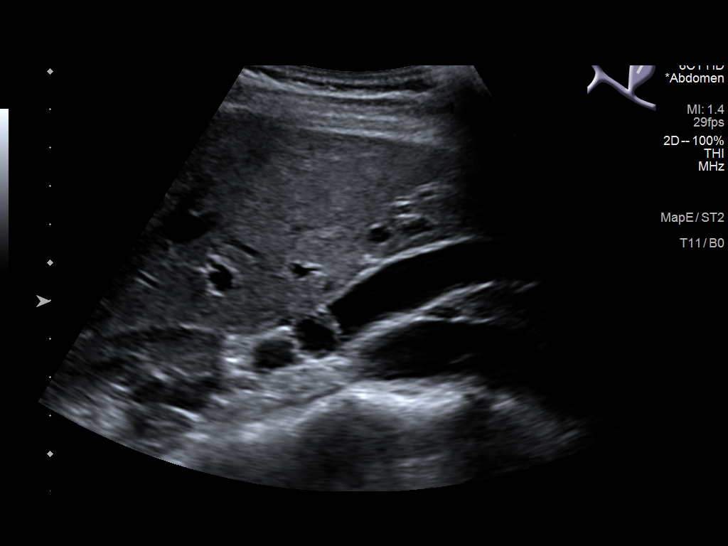
[im 9/99]
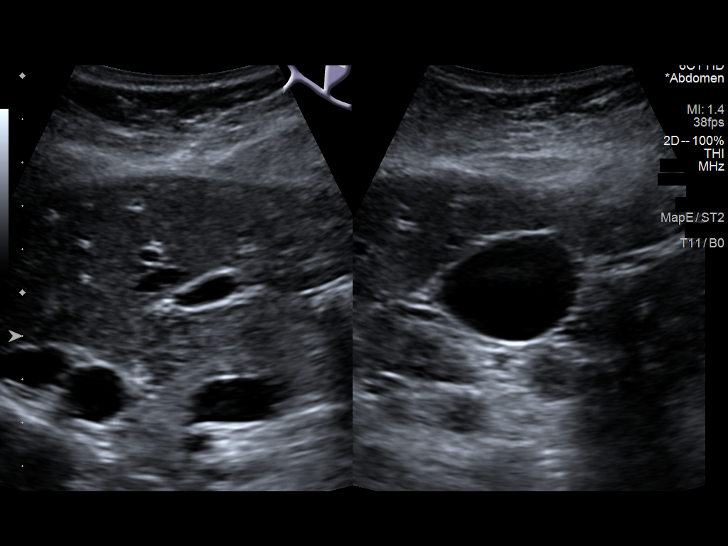
[im 17/99]
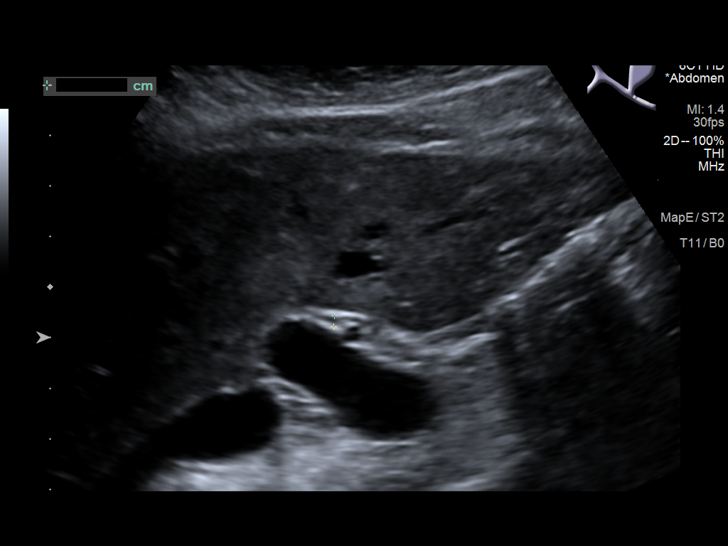
[im 25/99]
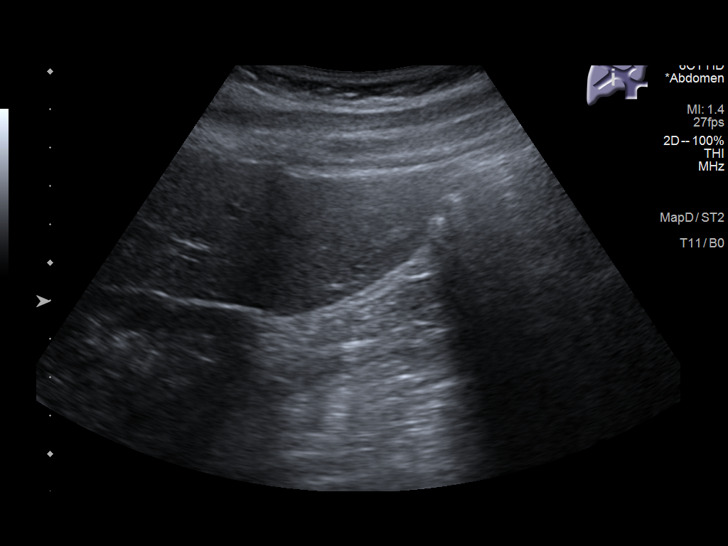
[im 33/99]
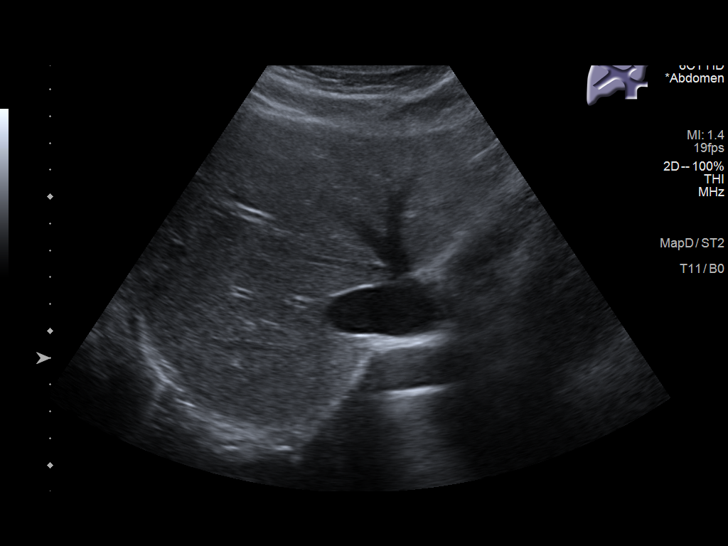
[im 37/99]
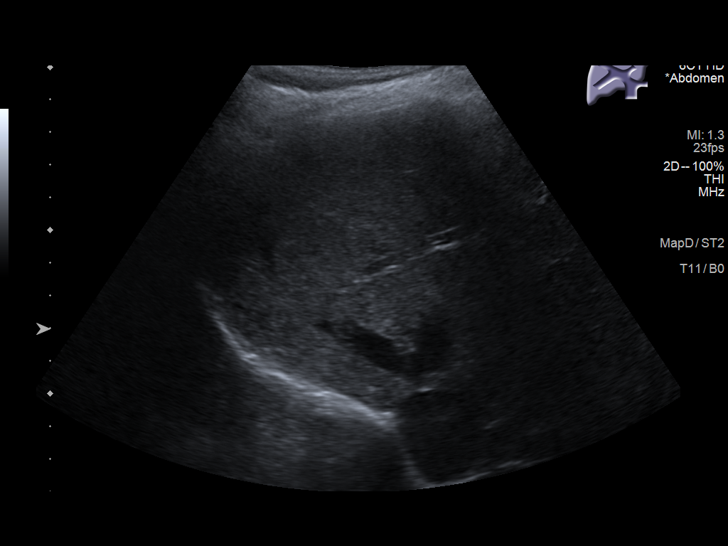
[im 45/99]
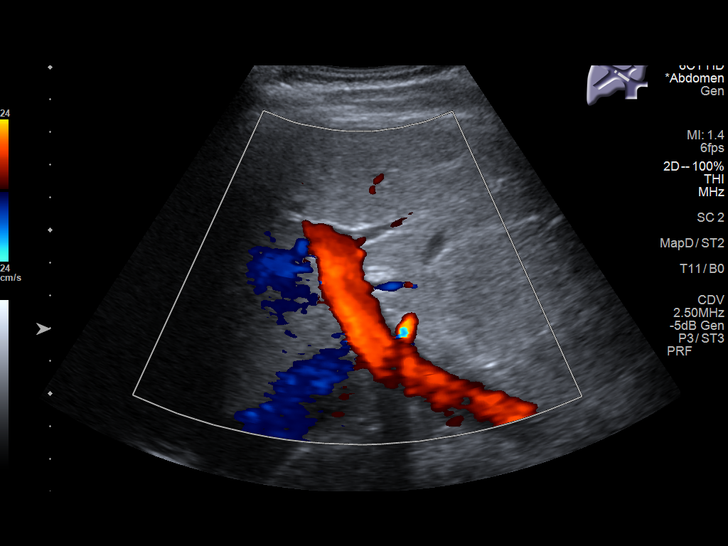
[im 54/99]
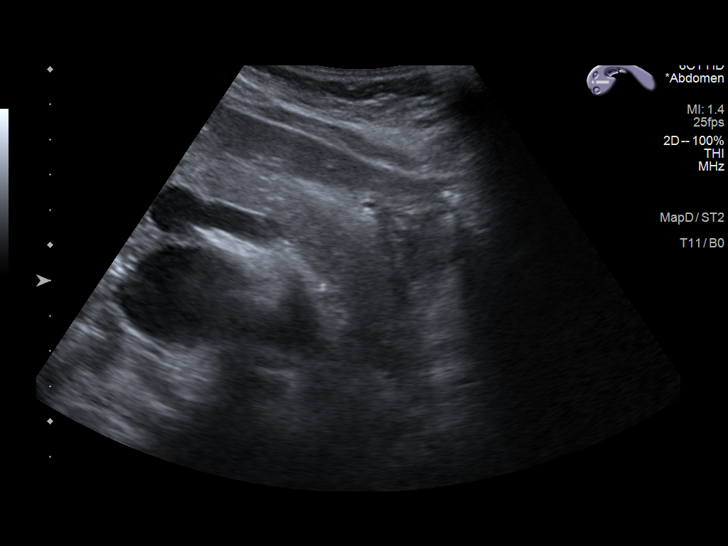
[im 62/99]
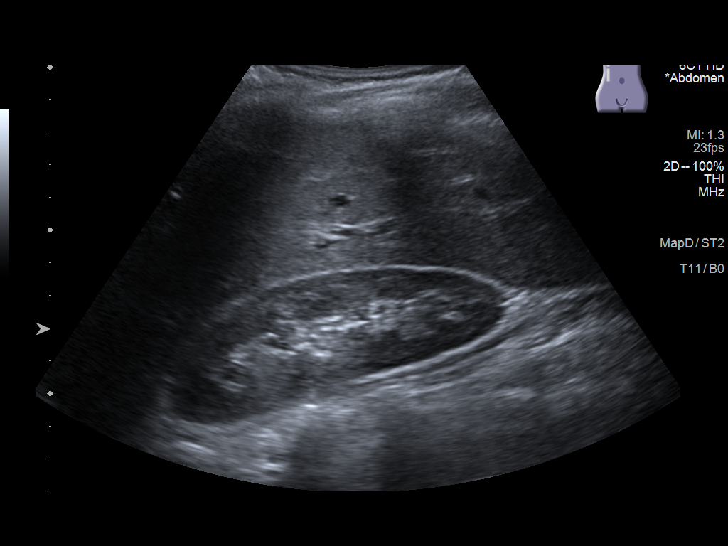
[im 66/99]
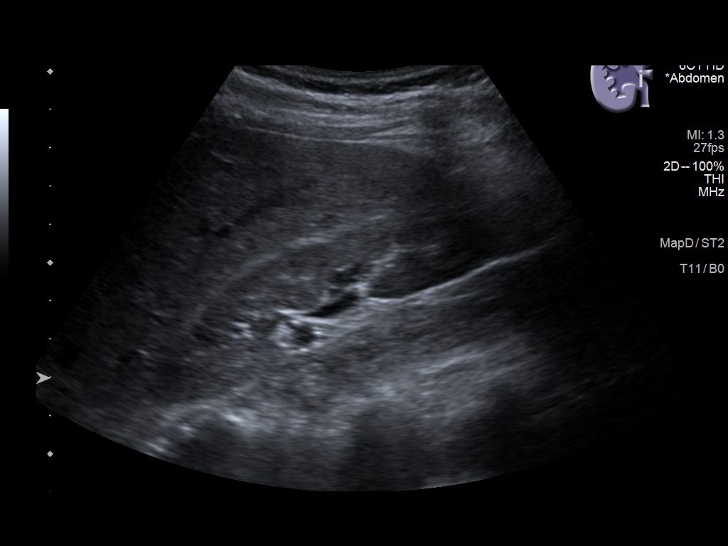
[im 74/99]
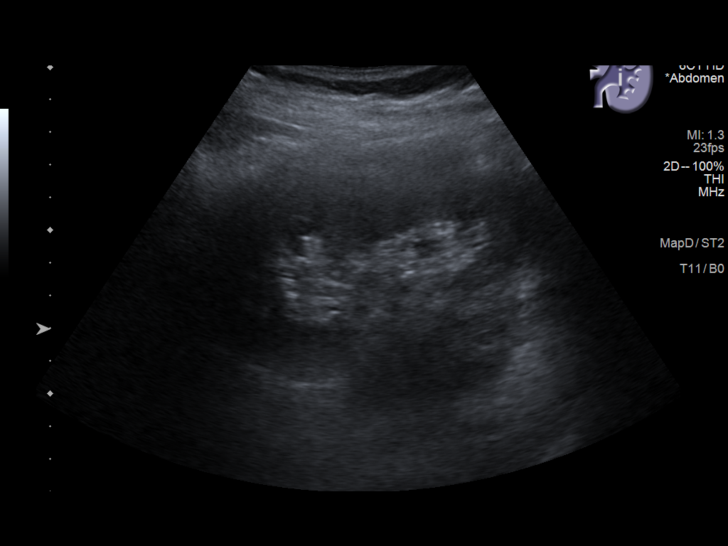
[im 82/99]
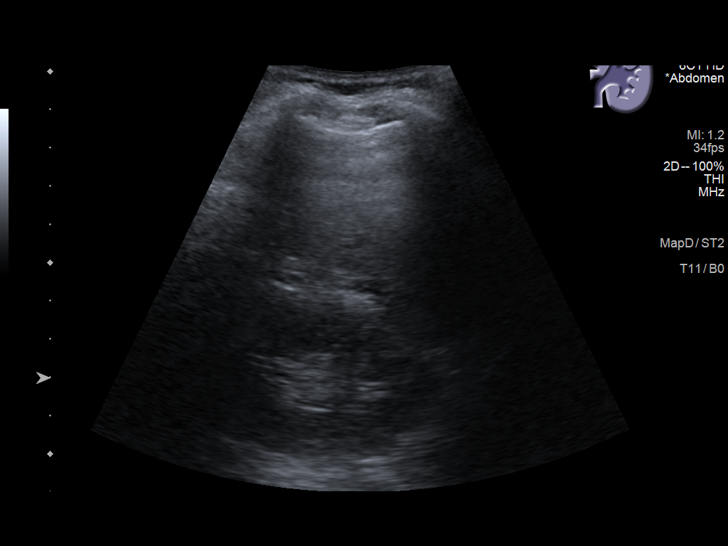
[im 90/99]
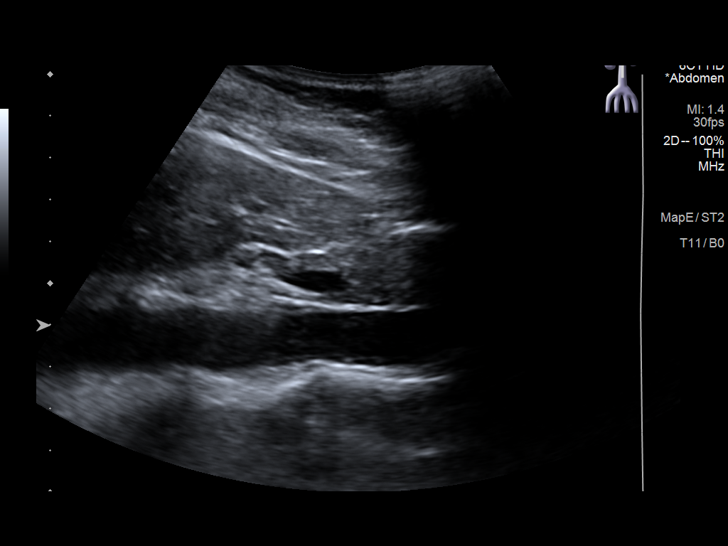
[im 99/99]
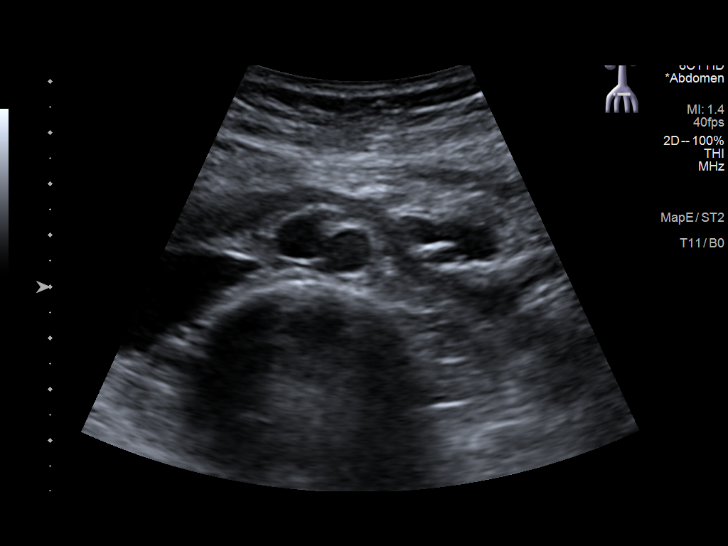

[14 of 25 positions shown; findings below may reference images not displayed]

FINDINGS: Gallbladder: No gallstones or wall thickening visualized. No
sonographic Murphy sign noted by sonographer.

Common bile duct: Diameter: Normal caliber, 2 mm

Liver: No focal lesion identified. Within normal limits in
parenchymal echogenicity. Portal vein is patent on color Doppler
imaging with normal direction of blood flow towards the liver.

IVC: No abnormality visualized.

Pancreas: Visualized portion unremarkable.

Spleen: Size and appearance within normal limits.

Right Kidney: Length: 10.6 cm. Echogenicity within normal limits. No
mass or hydronephrosis visualized.

Left Kidney: Length: 10.9 cm. Echogenicity within normal limits. No
mass or hydronephrosis visualized.

Abdominal aorta: No aneurysm visualized.

Other findings: None.
IMPRESSION: Unremarkable abdominal ultrasound.

## 2022-06-12 ENCOUNTER — Other Ambulatory Visit: Payer: Self-pay

## 2022-06-12 MED ORDER — MYCOPHENOLATE MOFETIL 500 MG PO TABS
ORAL_TABLET | ORAL | 5 refills | Status: AC
  Filled 2022-06-12: qty 360, 90d supply, fill #0

## 2022-06-18 ENCOUNTER — Other Ambulatory Visit: Payer: Self-pay

## 2022-06-18 MED ORDER — AMOXICILLIN 500 MG PO CAPS
ORAL_CAPSULE | ORAL | 0 refills | Status: DC
  Filled 2022-06-18: qty 15, 14d supply, fill #0

## 2022-06-20 ENCOUNTER — Ambulatory Visit: Payer: No Typology Code available for payment source | Admitting: Dermatology

## 2022-07-12 ENCOUNTER — Other Ambulatory Visit
Admission: RE | Admit: 2022-07-12 | Discharge: 2022-07-12 | Disposition: A | Discharge status: Home / Self Care | Payer: No Typology Code available for payment source | Attending: Ophthalmology | Admitting: Ophthalmology

## 2022-07-12 DIAGNOSIS — Z5181 Encounter for therapeutic drug level monitoring: Secondary | ICD-10-CM | POA: Insufficient documentation

## 2022-07-12 LAB — CBC WITH DIFFERENTIAL/PLATELET
Abs Immature Granulocytes: 0.01 10*3/uL (ref 0.00–0.07)
Basophils Absolute: 0 10*3/uL (ref 0.0–0.1)
Basophils Relative: 1 %
Eosinophils Absolute: 0 10*3/uL (ref 0.0–0.5)
Eosinophils Relative: 1 %
HCT: 42.1 % (ref 36.0–46.0)
Hemoglobin: 13.6 g/dL (ref 12.0–15.0)
Immature Granulocytes: 0 %
Lymphocytes Relative: 37 %
Lymphs Abs: 1.6 10*3/uL (ref 0.7–4.0)
MCH: 28.2 pg (ref 26.0–34.0)
MCHC: 32.3 g/dL (ref 30.0–36.0)
MCV: 87.3 fL (ref 80.0–100.0)
Monocytes Absolute: 0.3 10*3/uL (ref 0.1–1.0)
Monocytes Relative: 7 %
Neutro Abs: 2.3 10*3/uL (ref 1.7–7.7)
Neutrophils Relative %: 54 %
Platelets: 208 10*3/uL (ref 150–400)
RBC: 4.82 MIL/uL (ref 3.87–5.11)
RDW: 12.5 % (ref 11.5–15.5)
WBC: 4.2 10*3/uL (ref 4.0–10.5)
nRBC: 0 % (ref 0.0–0.2)

## 2022-07-12 LAB — COMPREHENSIVE METABOLIC PANEL
ALT: 15 U/L (ref 0–44)
AST: 16 U/L (ref 15–41)
Albumin: 4.7 g/dL (ref 3.5–5.0)
Alkaline Phosphatase: 46 U/L (ref 38–126)
Anion gap: 9 (ref 5–15)
BUN: 13 mg/dL (ref 6–20)
CO2: 25 mmol/L (ref 22–32)
Calcium: 9.5 mg/dL (ref 8.9–10.3)
Chloride: 106 mmol/L (ref 98–111)
Creatinine, Ser: 0.82 mg/dL (ref 0.44–1.00)
GFR, Estimated: >60 mL/min (ref >60)
Glucose, Bld: 83 mg/dL (ref 70–99)
Potassium: 3.8 mmol/L (ref 3.5–5.1)
Sodium: 140 mmol/L (ref 135–145)
Total Bilirubin: 2.2 mg/dL — ABNORMAL HIGH (ref 0.3–1.2)
Total Protein: 7.2 g/dL (ref 6.5–8.1)

## 2022-08-01 ENCOUNTER — Encounter: Payer: Self-pay | Admitting: Genetic Counselor

## 2022-08-01 NOTE — Progress Notes (Signed)
UPDATE: DICER1 VUS (c.4888C>T) was reclassified to likely benign. The report date is 08/17/2021.

## 2022-08-02 ENCOUNTER — Other Ambulatory Visit: Payer: Self-pay

## 2022-08-31 ENCOUNTER — Other Ambulatory Visit
Admission: RE | Admit: 2022-08-31 | Discharge: 2022-08-31 | Disposition: A | Discharge status: Home / Self Care | Payer: No Typology Code available for payment source | Source: Ambulatory Visit | Attending: Ophthalmology | Admitting: Ophthalmology

## 2022-08-31 DIAGNOSIS — Z5181 Encounter for therapeutic drug level monitoring: Secondary | ICD-10-CM | POA: Diagnosis present

## 2022-08-31 DIAGNOSIS — H30023 Focal chorioretinal inflammation of posterior pole, bilateral: Secondary | ICD-10-CM | POA: Insufficient documentation

## 2022-08-31 LAB — COMPREHENSIVE METABOLIC PANEL
ALT: 14 U/L (ref 0–44)
AST: 16 U/L (ref 15–41)
Albumin: 4.5 g/dL (ref 3.5–5.0)
Alkaline Phosphatase: 51 U/L (ref 38–126)
Anion gap: 8 (ref 5–15)
BUN: 12 mg/dL (ref 6–20)
CO2: 23 mmol/L (ref 22–32)
Calcium: 9.6 mg/dL (ref 8.9–10.3)
Chloride: 107 mmol/L (ref 98–111)
Creatinine, Ser: 0.73 mg/dL (ref 0.44–1.00)
GFR, Estimated: >60 mL/min (ref >60)
Glucose, Bld: 91 mg/dL (ref 70–99)
Potassium: 3.9 mmol/L (ref 3.5–5.1)
Sodium: 138 mmol/L (ref 135–145)
Total Bilirubin: 1.7 mg/dL — ABNORMAL HIGH (ref 0.3–1.2)
Total Protein: 7.2 g/dL (ref 6.5–8.1)

## 2022-08-31 LAB — CBC WITH DIFFERENTIAL/PLATELET
Abs Immature Granulocytes: 0.01 10*3/uL (ref 0.00–0.07)
Basophils Absolute: 0 10*3/uL (ref 0.0–0.1)
Basophils Relative: 1 %
Eosinophils Absolute: 0.1 10*3/uL (ref 0.0–0.5)
Eosinophils Relative: 1 %
HCT: 42.3 % (ref 36.0–46.0)
Hemoglobin: 13.9 g/dL (ref 12.0–15.0)
Immature Granulocytes: 0 %
Lymphocytes Relative: 40 %
Lymphs Abs: 2 10*3/uL (ref 0.7–4.0)
MCH: 28.6 pg (ref 26.0–34.0)
MCHC: 32.9 g/dL (ref 30.0–36.0)
MCV: 87 fL (ref 80.0–100.0)
Monocytes Absolute: 0.4 10*3/uL (ref 0.1–1.0)
Monocytes Relative: 8 %
Neutro Abs: 2.6 10*3/uL (ref 1.7–7.7)
Neutrophils Relative %: 50 %
Platelets: 204 10*3/uL (ref 150–400)
RBC: 4.86 MIL/uL (ref 3.87–5.11)
RDW: 12.3 % (ref 11.5–15.5)
WBC: 5.1 10*3/uL (ref 4.0–10.5)
nRBC: 0 % (ref 0.0–0.2)

## 2022-09-03 ENCOUNTER — Other Ambulatory Visit: Payer: Self-pay

## 2022-09-03 ENCOUNTER — Telehealth: Payer: No Typology Code available for payment source | Admitting: Physician Assistant

## 2022-09-03 DIAGNOSIS — R3989 Other symptoms and signs involving the genitourinary system: Secondary | ICD-10-CM | POA: Diagnosis not present

## 2022-09-03 MED ORDER — CEPHALEXIN 500 MG PO CAPS
500.0000 mg | ORAL_CAPSULE | Freq: Two times a day (BID) | ORAL | 0 refills | Status: DC
  Filled 2022-09-03: qty 14, 7d supply, fill #0

## 2022-09-03 NOTE — Progress Notes (Signed)

## 2022-10-16 ENCOUNTER — Other Ambulatory Visit: Payer: Self-pay

## 2022-10-16 MED ORDER — MYCOPHENOLATE MOFETIL 500 MG PO TABS
ORAL_TABLET | ORAL | 5 refills | Status: DC
  Filled 2022-10-16 – 2022-10-30 (×2): qty 360, 90d supply, fill #0
  Filled 2023-01-03: qty 360, 90d supply, fill #1

## 2022-10-26 ENCOUNTER — Other Ambulatory Visit: Payer: Self-pay

## 2022-10-30 ENCOUNTER — Other Ambulatory Visit: Payer: Self-pay

## 2022-11-20 ENCOUNTER — Encounter: Payer: 59 | Admitting: Nurse Practitioner

## 2022-12-03 ENCOUNTER — Encounter: Payer: No Typology Code available for payment source | Admitting: Family Medicine

## 2022-12-06 ENCOUNTER — Encounter: Payer: Self-pay | Admitting: Genetic Counselor

## 2022-12-11 ENCOUNTER — Telehealth: Payer: Commercial Managed Care - PPO | Admitting: Physician Assistant

## 2022-12-11 ENCOUNTER — Other Ambulatory Visit: Payer: Self-pay

## 2022-12-11 DIAGNOSIS — J028 Acute pharyngitis due to other specified organisms: Secondary | ICD-10-CM

## 2022-12-11 DIAGNOSIS — B9689 Other specified bacterial agents as the cause of diseases classified elsewhere: Secondary | ICD-10-CM

## 2022-12-11 MED ORDER — AMOXICILLIN 500 MG PO CAPS
500.0000 mg | ORAL_CAPSULE | Freq: Two times a day (BID) | ORAL | 0 refills | Status: AC
  Filled 2022-12-11: qty 20, 10d supply, fill #0

## 2022-12-11 NOTE — Progress Notes (Signed)

## 2023-01-01 ENCOUNTER — Encounter: Payer: No Typology Code available for payment source | Admitting: Internal Medicine

## 2023-01-03 ENCOUNTER — Encounter: Payer: Self-pay | Admitting: Nurse Practitioner

## 2023-01-03 ENCOUNTER — Other Ambulatory Visit: Payer: Self-pay

## 2023-01-03 ENCOUNTER — Ambulatory Visit: Payer: Commercial Managed Care - PPO | Admitting: Nurse Practitioner

## 2023-01-03 VITALS — BP 126/82 | HR 78 | Temp 97.8°F | Ht 68.25 in | Wt 148.8 lb

## 2023-01-03 DIAGNOSIS — R599 Enlarged lymph nodes, unspecified: Secondary | ICD-10-CM

## 2023-01-03 MED ORDER — AMOXICILLIN-POT CLAVULANATE 875-125 MG PO TABS
1.0000 | ORAL_TABLET | Freq: Two times a day (BID) | ORAL | 0 refills | Status: DC
  Filled 2023-01-03: qty 14, 7d supply, fill #0

## 2023-01-03 NOTE — Progress Notes (Unsigned)
Established Patient Office Visit  Subjective:  Patient ID: Stefanie Gomez, female    DOB: 10-02-1991  Age: 32 y.o. MRN: [id number]  CC:  Chief Complaint  Patient presents with   Acute Visit    Left neck swelling  Had Strep throat a month ago-took Antibiotic everything is better except the left side of the neck     HPI  Stefanie Gomez presents for lymph node sweleling painless until she presses on it.   HPI   Past Medical History:  Diagnosis Date   Adenocarcinoma in situ (AIS) of uterine cervix    s/p total hysteretectomy including uterus, cervix fallopian tubes 05/2017 Dr. Georgianne Fick ovaries intact    Cancer [facility])    CMV (cytomegalovirus infection) status positive ([place])    Complication of anesthesia    COVID-19    07/2020   Family history of adverse reaction to anesthesia    [facility]   Family history of colorectal cancer    Family history of liver cancer    HSV-1 (herpes simplex virus 1) infection    Medical history non-contributory    PONV (postoperative nausea and vomiting) 09/2016   X 1 AFTER BTL   Positive Lyme disease serology     Past Surgical History:  Procedure Laterality Date   ABDOMINAL HYSTERECTOMY     05/2017 Dr. Georgianne Fick    APPENDECTOMY     2010   CERVICAL CONE BIOPSY  03/05/2017   [facility] [place]   CERVICAL CONIZATION W/BX N/A 03/05/2017   Procedure: CONIZATION CERVIX WITH BIOPSY;  Surgeon: Malachy Mood, MD;  Location: [facility] OR[facility];  Service: Gynecology;  Laterality: N/A;   COLONOSCOPY WITH PROPOFOL N/A 10/05/2019   Procedure: COLONOSCOPY WITH PROPOFOL;  Surgeon: Toledo, Benay Pike, MD;  Location: [facility];  Service: Gastroenterology;  Laterality: N/A;   CYSTOSCOPY N/A 05/07/2017   Procedure: CYSTOSCOPY;  Surgeon: Malachy Mood, MD;  Location: [facility];  Service: Gynecology;  Laterality: N/A;   LAPAROSCOPIC HYSTERECTOMY Bilateral 05/07/2017   Procedure: HYSTERECTOMY TOTAL LAPAROSCOPIC BILATERAL SALPINGECTOMY;  Surgeon: Malachy Mood, MD;  Location: [facility] OR[facility];  Service: Gynecology;  Laterality: Bilateral;   LAPAROSCOPIC TUBAL LIGATION Bilateral 09/25/2016   Procedure: LAPAROSCOPIC TUBAL LIGATION;  Surgeon: Malachy Mood, MD;  Location: [facility];  Service: Gynecology;  Laterality: Bilateral;   TONSILLECTOMY     TUBAL LIGATION  09/2016   WISDOM TOOTH EXTRACTION      Family History  Problem Relation Age of Onset   Cancer Mother        dx 05/2021 age 61   Hyperlipidemia Mother    Hypertension Mother    Breast cancer Mother 62   Rectal cancer Father 63       metastasized to lung   Pulmonary embolism Father    Healthy Daughter    Healthy Daughter    COPD Maternal Aunt    Liver cancer Maternal Aunt 11   Colon cancer Paternal Great-grandfather    Colon cancer Other        paternal great-uncle   Colon cancer Other        paternal great-uncle   Colon cancer Other        paternal first cousin once removed    Social History   Socioeconomic History   Marital status: Married    Spouse name: Not on file   Number of children: Not on file   Years of education: Not on file   Highest education level: Not on file  Occupational History  Not on file  Tobacco Use   Smoking status: Never   Smokeless tobacco: Never  Vaping Use   Vaping Use: Never used  Substance and Sexual Activity   Alcohol use: No   Drug use: No   Sexual activity: Yes    Birth control/protection: Surgical    Comment: BTL  Other Topics Concern   Not on file  Social History Narrative   Married 2 kids girls    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 , 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    No drinking smoking    No guns, wears seatbelts    Safe in relationship    Social Determinants of Health   Financial Resource Strain: Not on file  Food Insecurity: Not on file  Transportation Needs: Not on file  Physical Activity: Not on file  Stress: Not on file  Social Connections: Not on file  Intimate Partner Violence: Not on file     Outpatient Medications Prior  to Visit  Medication Sig Dispense Refill   mycophenolate (CELLCEPT) 500 MG tablet Take 2 tablets (1,000 mg total) by mouth 2 times daily. Taken by mouth as directed 360 tablet 5   mycophenolate (CELLCEPT) 500 MG tablet Take 2 tablets (1,000 mg total) by mouth 2 times daily. Taken by mouth as directed 360 tablet 5   mycophenolate (CELLCEPT) 500 MG tablet Take 2 tablets (1,000 mg total) by mouth 2 times daily. Taken by mouth as directed 360 tablet 5   mycophenolate (CELLCEPT) 500 MG tablet Take 2 tablets (1,000 mg total) by mouth 2 times daily. Take as directed. 360 tablet 5   prednisoLONE acetate (PRED FORTE) 1 % ophthalmic suspension SMARTSIG:In Eye(s)     mycophenolate (CELLCEPT) 500 MG tablet Take 2 tablets (1,000 mg total) by mouth 2 times daily. Taken by mouth as directed 120 tablet 3   prednisoLONE acetate (PRED FORTE) 1 % ophthalmic suspension Place 1 drop into both eyes 4 times daily. 15 mL 5   Azelastine-Fluticasone 137-50 MCG/ACT SUSP Place 1 spray into the nose every 12 (twelve) hours. (Patient not taking: Reported on 12/28/2021) 23 g 0   LOTEMAX SM 0.38 % GEL  (Patient not taking: Reported on 01/03/2023)     mycophenolate (CELLCEPT) 500 MG tablet Take 2 tablets (1,000 mg total) by mouth 2 times daily. Taken by mouth as directed (Patient not taking: Reported on 01/03/2023) 120 tablet 3   No facility-administered medications prior to visit.    Allergies  Allergen Reactions   Tetracaine Other (See Comments)    Extreme eye redness   Sulfamethoxazole-Trimethoprim Rash    ROS Review of Systems  Constitutional: Negative.   HENT:  Negative for nosebleeds.        Left lyphh node swelling  Eyes: Negative.   Respiratory:  Negative for cough and chest tightness.   Cardiovascular: Negative.   Neurological: Negative.   Psychiatric/Behavioral: Negative.        Objective:    Physical Exam Constitutional:      Appearance: Normal appearance.  HENT:     Right Ear: Tympanic membrane  normal.     Left Ear: Tympanic membrane normal.     Mouth/Throat:     Mouth: Mucous membranes are moist.  Eyes:     Pupils: Pupils are equal, round, and reactive to light.  Neck:      Comments: Pea sized lymph node Cardiovascular:     Rate and Rhythm: Normal rate and regular rhythm.  Pulmonary:     Effort: Pulmonary effort is normal.  Breath sounds: Normal breath sounds.  Skin:    General: Skin is warm.     Findings: No bruising.  Neurological:     General: No focal deficit present.     Mental Status: She is alert and oriented to person, place, and time. Mental status is at baseline.     BP 126/82   Pulse 78   Temp 97.8 F (36.6 C)   Ht 5' 8.25" (1.734 m)   Wt 148 lb 12.8 oz (67.5 kg)   LMP 02/04/2017   SpO2 99%   BMI 22.46 kg/m  Wt Readings from Last 3 Encounters:  01/03/23 148 lb 12.8 oz (67.5 kg)  02/20/22 155 lb 6 oz (70.5 kg)  12/28/21 152 lb (68.9 kg)     Health Maintenance  Topic Date Due   DTaP/Tdap/Td (1 - Tdap) Never done   COVID-19 Vaccine (5 - 2023-24 season) 01/18/2023 (Originally 07/06/2022)   INFLUENZA VACCINE  02/03/2023 (Originally 06/05/2022)   PAP SMEAR-Modifier  12/28/2024   Hepatitis C Screening  Completed   HIV Screening  Completed   HPV VACCINES  Aged Out    There are no preventive care reminders to display for this patient.  Lab Results  Component Value Date   TSH 1.07 12/28/2021   Lab Results  Component Value Date   WBC 5.1 08/31/2022   HGB 13.9 08/31/2022   HCT 42.3 08/31/2022   MCV 87.0 08/31/2022   PLT 204 08/31/2022   Lab Results  Component Value Date   NA 138 08/31/2022   K 3.9 08/31/2022   CO2 23 08/31/2022   GLUCOSE 91 08/31/2022   BUN 12 08/31/2022   CREATININE 0.73 08/31/2022   BILITOT 1.7 (H) 08/31/2022   ALKPHOS 51 08/31/2022   AST 16 08/31/2022   ALT 14 08/31/2022   PROT 7.2 08/31/2022   ALBUMIN 4.5 08/31/2022   CALCIUM 9.6 08/31/2022   ANIONGAP 8 08/31/2022   GFR 85.39 03/05/2019   Lab Results   Component Value Date   CHOL 173 12/28/2021   Lab Results  Component Value Date   HDL 63.60 12/28/2021   Lab Results  Component Value Date   LDLCALC 96 12/28/2021   Lab Results  Component Value Date   TRIG 66.0 12/28/2021   Lab Results  Component Value Date   CHOLHDL 3 12/28/2021   No results found for: "HGBA1C"    Assessment & Plan:   Problem List Items Addressed This Visit   None    No orders of the defined types were placed in this encounter.    Follow-up: No follow-ups on file.    Theresia Lo, NP

## 2023-01-03 NOTE — Patient Instructions (Addendum)
Started on Augmentin twice a day for 7 day. If symptoms do not improves please call the office for further evaluation.

## 2023-01-08 DIAGNOSIS — R599 Enlarged lymph nodes, unspecified: Secondary | ICD-10-CM | POA: Insufficient documentation

## 2023-01-08 NOTE — Assessment & Plan Note (Addendum)
Will treat her with Augmentin. If the lymphadenopathy did not resolve we will do further evaluation and imaging.

## 2023-01-10 ENCOUNTER — Other Ambulatory Visit: Payer: Self-pay

## 2023-02-19 ENCOUNTER — Other Ambulatory Visit: Payer: Self-pay

## 2023-02-19 DIAGNOSIS — H30033 Focal chorioretinal inflammation, peripheral, bilateral: Secondary | ICD-10-CM | POA: Diagnosis not present

## 2023-02-19 DIAGNOSIS — H3581 Retinal edema: Secondary | ICD-10-CM | POA: Diagnosis not present

## 2023-02-19 DIAGNOSIS — Z79899 Other long term (current) drug therapy: Secondary | ICD-10-CM | POA: Diagnosis not present

## 2023-02-19 MED ORDER — MYCOPHENOLATE MOFETIL 500 MG PO TABS
1000.0000 mg | ORAL_TABLET | Freq: Two times a day (BID) | ORAL | 5 refills | Status: AC
  Filled 2023-02-19 – 2023-05-26 (×2): qty 360, 90d supply, fill #0
  Filled 2023-09-19: qty 360, 90d supply, fill #1

## 2023-02-22 ENCOUNTER — Other Ambulatory Visit
Admission: RE | Admit: 2023-02-22 | Discharge: 2023-02-22 | Disposition: A | Discharge status: Home / Self Care | Payer: Commercial Managed Care - PPO | Attending: Ophthalmology | Admitting: Ophthalmology

## 2023-02-22 DIAGNOSIS — H30033 Focal chorioretinal inflammation, peripheral, bilateral: Secondary | ICD-10-CM | POA: Insufficient documentation

## 2023-02-22 DIAGNOSIS — Z5181 Encounter for therapeutic drug level monitoring: Secondary | ICD-10-CM | POA: Insufficient documentation

## 2023-02-22 LAB — CBC WITH DIFFERENTIAL/PLATELET
Abs Immature Granulocytes: 0.01 10*3/uL (ref 0.00–0.07)
Basophils Absolute: 0 10*3/uL (ref 0.0–0.1)
Basophils Relative: 1 %
Eosinophils Absolute: 0 10*3/uL (ref 0.0–0.5)
Eosinophils Relative: 1 %
HCT: 40.8 % (ref 36.0–46.0)
Hemoglobin: 13 g/dL (ref 12.0–15.0)
Immature Granulocytes: 0 %
Lymphocytes Relative: 35 %
Lymphs Abs: 1.5 10*3/uL (ref 0.7–4.0)
MCH: 28.8 pg (ref 26.0–34.0)
MCHC: 31.9 g/dL (ref 30.0–36.0)
MCV: 90.5 fL (ref 80.0–100.0)
Monocytes Absolute: 0.3 10*3/uL (ref 0.1–1.0)
Monocytes Relative: 7 %
Neutro Abs: 2.4 10*3/uL (ref 1.7–7.7)
Neutrophils Relative %: 56 %
Platelets: 174 10*3/uL (ref 150–400)
RBC: 4.51 MIL/uL (ref 3.87–5.11)
RDW: 12.6 % (ref 11.5–15.5)
WBC: 4.3 10*3/uL (ref 4.0–10.5)
nRBC: 0 % (ref 0.0–0.2)

## 2023-02-22 LAB — COMPREHENSIVE METABOLIC PANEL
ALT: 14 U/L (ref 0–44)
AST: 15 U/L (ref 15–41)
Albumin: 4.1 g/dL (ref 3.5–5.0)
Alkaline Phosphatase: 49 U/L (ref 38–126)
Anion gap: 7 (ref 5–15)
BUN: 15 mg/dL (ref 6–20)
CO2: 24 mmol/L (ref 22–32)
Calcium: 9.1 mg/dL (ref 8.9–10.3)
Chloride: 109 mmol/L (ref 98–111)
Creatinine, Ser: 0.76 mg/dL (ref 0.44–1.00)
GFR, Estimated: >60 mL/min (ref >60)
Glucose, Bld: 84 mg/dL (ref 70–99)
Potassium: 4 mmol/L (ref 3.5–5.1)
Sodium: 140 mmol/L (ref 135–145)
Total Bilirubin: 1.4 mg/dL — ABNORMAL HIGH (ref 0.3–1.2)
Total Protein: 6.4 g/dL — ABNORMAL LOW (ref 6.5–8.1)

## 2023-04-10 ENCOUNTER — Ambulatory Visit: Payer: Commercial Managed Care - PPO | Admitting: Family Medicine

## 2023-04-10 ENCOUNTER — Encounter: Payer: Self-pay | Admitting: Family Medicine

## 2023-04-10 VITALS — BP 98/75 | HR 62 | Ht 67.0 in | Wt 150.2 lb

## 2023-04-10 DIAGNOSIS — H2013 Chronic iridocyclitis, bilateral: Secondary | ICD-10-CM | POA: Diagnosis not present

## 2023-04-10 DIAGNOSIS — Z800 Family history of malignant neoplasm of digestive organs: Secondary | ICD-10-CM | POA: Diagnosis not present

## 2023-04-10 DIAGNOSIS — D069 Carcinoma in situ of cervix, unspecified: Secondary | ICD-10-CM

## 2023-04-10 NOTE — Patient Instructions (Addendum)
It was a pleasure meeting you today. Thank you for allowing me to take part in your health care.  Our goals for today as we discussed include:  Will check on Tetanus vaccine  Will follow up with PAP  Colonoscopy due 11/20205  Follow up in 1 year for annual   Follow up as needed   If you have any questions or concerns, please do not hesitate to call the office at [phone].  I look forward to our next visit and until then take care and stay safe.  Regards,   Dana Allan, MD   [facility]

## 2023-04-10 NOTE — Progress Notes (Signed)
   SUBJECTIVE:   Chief Complaint  Patient presents with   Transitions Of Care    Patient has no concerns, would like to establish care    HPI Presents to clinic to transfer care  No acute concerns today.  Uveitis Doing well.  Takes Cellcept 500 mg BID and tolerating well.  Follows with Dr Clelia Croft, at [facility] in [place].  Works at [employer] PMH / PSH: Uveitis Adenocarcinoma in situ of uterine cervix s/p TAH    Paternal history of colon cancer OBJECTIVE:  BP 98/75 (BP Location: Left Arm, Patient Position: Sitting, Cuff Size: Normal)   Pulse 62   Ht 5\' 7"  (1.702 m)   Wt 150 lb 3.2 oz (68.1 kg)   LMP 02/04/2017   SpO2 97%   BMI 23.52 kg/m    Physical Exam Vitals reviewed.  Constitutional:      General: She is not in acute distress.    Appearance: She is normal weight. She is not ill-appearing.  HENT:     Head: Normocephalic.  Eyes:     Conjunctiva/sclera: Conjunctivae normal.  Neck:     Thyroid: No thyromegaly or thyroid tenderness.  Cardiovascular:     Rate and Rhythm: Normal rate and regular rhythm.     Pulses: Normal pulses.  Pulmonary:     Effort: Pulmonary effort is normal.     Breath sounds: Normal breath sounds.  Abdominal:     General: Bowel sounds are normal.  Neurological:     Mental Status: She is alert. Mental status is at baseline.  Psychiatric:        Mood and Affect: Mood normal.        Behavior: Behavior normal.        Thought Content: Thought content normal.        Judgment: Judgment normal.     ASSESSMENT/PLAN:  Chronic uveitis of both eyes Assessment & Plan: Chronic.  Doing well on current medication Continue Cellcept 500 mg BID Continue Pred Forte 1 % eye drops as prescribed Follow up with Ophthalmology as scheduled   Adenocarcinoma in situ (AIS) of uterine cervix Assessment & Plan: History of Adenocarcinoma of uterine cervix s/p TAH 2018.   Per ASCCP guidelines long term surveillence after treatment for HSIL or  AIS involves repeat HPV based testing at 3 year interval for 25 years. PAP due 2026   Family history of colorectal cancer Assessment & Plan: 8 mm polyp biopsied on recent Colonoscopy 2020.  Family history of paternal colon cancer. Colonoscopy follow up every 5 years Follows with Gi, Dr Norma Fredrickson     [facility] Colonoscopy due 2025.  Every 5 year, family history of colon cancer and personal history of polyp removal. Last PAP 2023. Normal.  History of Adenocarcinoma of uterine cervix s/p TAH 2018.  Per ASCCP guidelines long term surveillence after treatment for HSIL pt AIS involves repeat HPV based testing at 3 year interval for 25 years.  Tetanus up to date   PDMP reviewed   Return in about 1 year (around 04/09/2024).  Dana Allan, MD

## 2023-04-16 ENCOUNTER — Encounter: Payer: Self-pay | Admitting: Family Medicine

## 2023-04-16 NOTE — Assessment & Plan Note (Signed)
Chronic.  Doing well on current medication Continue Cellcept 500 mg BID Continue Pred Forte 1 % eye drops as prescribed Follow up with Ophthalmology as scheduled

## 2023-04-16 NOTE — Assessment & Plan Note (Signed)
8 mm polyp biopsied on recent Colonoscopy 2020.  Family history of paternal colon cancer. Colonoscopy follow up every 5 years Follows with Gi, Dr Norma Fredrickson

## 2023-04-16 NOTE — Assessment & Plan Note (Addendum)
History of Adenocarcinoma of uterine cervix s/p TAH 2018.   Per ASCCP guidelines long term surveillence after treatment for HSIL or AIS involves repeat HPV based testing at 3 year interval for 25 years. PAP due 2026

## 2023-05-27 ENCOUNTER — Other Ambulatory Visit: Payer: Self-pay

## 2023-07-02 ENCOUNTER — Other Ambulatory Visit: Payer: Self-pay

## 2023-07-02 DIAGNOSIS — H30033 Focal chorioretinal inflammation, peripheral, bilateral: Secondary | ICD-10-CM | POA: Diagnosis not present

## 2023-07-02 DIAGNOSIS — Z79899 Other long term (current) drug therapy: Secondary | ICD-10-CM | POA: Diagnosis not present

## 2023-07-02 DIAGNOSIS — H3581 Retinal edema: Secondary | ICD-10-CM | POA: Diagnosis not present

## 2023-07-02 MED ORDER — MYCOPHENOLATE MOFETIL 500 MG PO TABS
1000.0000 mg | ORAL_TABLET | Freq: Two times a day (BID) | ORAL | 5 refills | Status: AC
  Filled 2023-07-02 – 2023-12-18 (×2): qty 360, 90d supply, fill #0
  Filled 2024-05-04: qty 360, 90d supply, fill #1

## 2023-10-06 ENCOUNTER — Other Ambulatory Visit: Payer: Self-pay

## 2023-10-06 ENCOUNTER — Telehealth: Payer: Commercial Managed Care - PPO | Admitting: Family

## 2023-10-06 DIAGNOSIS — J208 Acute bronchitis due to other specified organisms: Secondary | ICD-10-CM

## 2023-10-06 DIAGNOSIS — B9689 Other specified bacterial agents as the cause of diseases classified elsewhere: Secondary | ICD-10-CM

## 2023-10-06 MED ORDER — DOXYCYCLINE HYCLATE 100 MG PO TABS
100.0000 mg | ORAL_TABLET | Freq: Two times a day (BID) | ORAL | 0 refills | Status: DC
  Filled 2023-10-06: qty 20, 10d supply, fill #0

## 2023-10-06 NOTE — Progress Notes (Signed)
E-Visit for Cough   We are sorry that you are not feeling well.  Here is how we plan to help!  Based on your presentation I believe you most likely have A cough due to bacteria.  When patients have a fever and a productive cough with a change in color or increased sputum production, we are concerned about bacterial bronchitis.  If left untreated it can progress to pneumonia.  If your symptoms do not improve with your treatment plan it is important that you contact your provider.   I have prescribed Doxycycline 100 mg twice a day for 7 days     In addition you may use A non-prescription cough medication called Robitussin DAC. Take 2 teaspoons every 8 hours or Delsym: take 2 teaspoons every 12 hours., A non-prescription cough medication called Mucinex DM: take 2 tablets every 12 hours.    From your responses in the eVisit questionnaire you describe inflammation in the upper respiratory tract which is causing a significant cough.  This is commonly called Bronchitis and has four common causes:   Allergies Viral Infections Acid Reflux Bacterial Infection Allergies, viruses and acid reflux are treated by controlling symptoms or eliminating the cause. An example might be a cough caused by taking certain blood pressure medications. You stop the cough by changing the medication. Another example might be a cough caused by acid reflux. Controlling the reflux helps control the cough.  USE OF BRONCHODILATOR ("RESCUE") INHALERS: There is a risk from using your bronchodilator too frequently.  The risk is that over-reliance on a medication which only relaxes the muscles surrounding the breathing tubes can reduce the effectiveness of medications prescribed to reduce swelling and congestion of the tubes themselves.  Although you feel brief relief from the bronchodilator inhaler, your asthma may actually be worsening with the tubes becoming more swollen and filled with mucus.  This can delay other crucial  treatments, such as oral steroid medications. If you need to use a bronchodilator inhaler daily, several times per day, you should discuss this with your provider.  There are probably better treatments that could be used to keep your asthma under control.     HOME CARE Only take medications as instructed by your medical team. Complete the entire course of an antibiotic. Drink plenty of fluids and get plenty of rest. Avoid close contacts especially the very young and the elderly Cover your mouth if you cough or cough into your sleeve. Always remember to wash your hands A steam or ultrasonic humidifier can help congestion.   GET HELP RIGHT AWAY IF: You develop worsening fever. You become short of breath You cough up blood. Your symptoms persist after you have completed your treatment plan MAKE SURE YOU  Understand these instructions. Will watch your condition. Will get help right away if you are not doing well or get worse.    Thank you for choosing an e-visit.  Your e-visit answers were reviewed by a board certified advanced clinical practitioner to complete your personal care plan. Depending upon the condition, your plan could have included both over the counter or prescription medications.  Please review your pharmacy choice. Make sure the pharmacy is open so you can pick up prescription now. If there is a problem, you may contact your provider through [employer] and have the prescription routed to another pharmacy.  Your safety is important to [place]. If you have drug allergies check your prescription carefully.   For the next 24 hours you can use  MyChart to ask questions about today's visit, request a non-urgent call back, or ask for a work or school excuse. You will get an email in the next two days asking about your experience. I hope that your e-visit has been valuable and will speed your recovery.  Approximately 5 minutes was spent documenting and reviewing patient's chart.

## 2023-10-07 ENCOUNTER — Other Ambulatory Visit: Payer: Self-pay

## 2023-12-14 ENCOUNTER — Telehealth: Payer: Commercial Managed Care - PPO | Admitting: Nurse Practitioner

## 2023-12-14 DIAGNOSIS — R399 Unspecified symptoms and signs involving the genitourinary system: Secondary | ICD-10-CM | POA: Diagnosis not present

## 2023-12-14 MED ORDER — NITROFURANTOIN MONOHYD MACRO 100 MG PO CAPS
100.0000 mg | ORAL_CAPSULE | Freq: Two times a day (BID) | ORAL | 0 refills | Status: AC
Start: 2023-12-14 — End: 2023-12-19

## 2023-12-14 NOTE — Progress Notes (Signed)
 I have spent 5 minutes in review of e-visit questionnaire, review and updating patient chart, medical decision making and response to patient.   Claiborne Rigg, NP

## 2023-12-14 NOTE — Progress Notes (Signed)

## 2023-12-18 ENCOUNTER — Other Ambulatory Visit: Payer: Self-pay

## 2024-01-28 ENCOUNTER — Other Ambulatory Visit: Payer: Self-pay

## 2024-01-28 DIAGNOSIS — H30033 Focal chorioretinal inflammation, peripheral, bilateral: Secondary | ICD-10-CM | POA: Diagnosis not present

## 2024-01-28 DIAGNOSIS — Z79899 Other long term (current) drug therapy: Secondary | ICD-10-CM | POA: Diagnosis not present

## 2024-01-28 DIAGNOSIS — H3581 Retinal edema: Secondary | ICD-10-CM | POA: Diagnosis not present

## 2024-01-28 MED ORDER — MYCOPHENOLATE MOFETIL 500 MG PO TABS
1000.0000 mg | ORAL_TABLET | Freq: Two times a day (BID) | ORAL | 5 refills | Status: AC
  Filled 2024-01-28: qty 360, 90d supply, fill #0

## 2024-04-09 ENCOUNTER — Telehealth: Payer: Self-pay

## 2024-04-09 NOTE — Telephone Encounter (Signed)
 Copied from CRM [id number]. Topic: General - Other >> Apr 09, 2024 12:29 PM Luane Rumps D wrote: Reason for CRM: Patient is requesting a reschedule but kept appointment due to Dr. Sueanne Emerald leaving the practice and no later availability. Patient would like some communication on the process for transfer of care due to the PCP leaving - she said she had a high copay last time she did this.

## 2024-04-13 ENCOUNTER — Encounter: Payer: Commercial Managed Care - PPO | Admitting: Family Medicine

## 2024-07-07 ENCOUNTER — Ambulatory Visit (INDEPENDENT_AMBULATORY_CARE_PROVIDER_SITE_OTHER): Admitting: Nurse Practitioner

## 2024-07-07 VITALS — BP 118/76 | HR 82 | Temp 97.9°F | Ht 67.0 in | Wt 153.6 lb

## 2024-07-07 DIAGNOSIS — Z0000 Encounter for general adult medical examination without abnormal findings: Secondary | ICD-10-CM | POA: Diagnosis not present

## 2024-07-07 NOTE — Progress Notes (Unsigned)
 Established Patient Office Visit  Subjective:  Patient ID: Stefanie Gomez, female    DOB: [birth year]  Age: 33 y.o. MRN: [id number]  CC:  Chief Complaint  Patient presents with   Annual Exam   Discussed the use of a AI scribe software for clinical note transcription with the patient, who gave verbal consent to proceed.  HPI  Stefanie Gomez presents to the clinic for annual physical exam.   She has Uveitis, followed by Dr. Maree at [facility].   She has history of elevated bilirubin level, Unremarkable US   Abdomen completed in 2022  Diet: Patient does eat meat. Patient consumes fruits and veggies. Patient eat some fried food. Patient drinks water, tea and occasionally soda. Exercise: Walking 30 mins every day. Vaccine  Flu: will take at work Tetanus: 2017 COVID: pfizer Pap smear: 2023  Colonoscopy: 11/30/ 2020, repeat every 5 year, h/o polyps.  Pap smear: 2023, repeat in 2026  Family history:  Colon cancer: yes  Breast cancer:no   Ophthalmology: Regular HIV screening: Completed Hep C screening: Completed  HPI   Past Medical History:  Diagnosis Date   Adenocarcinoma in situ (AIS) of uterine cervix    s/p total hysteretectomy including uterus, cervix fallopian tubes 05/2017 Dr. Lake ovaries intact    Cancer [facility])    CMV (cytomegalovirus infection) status positive (HCC)    Complication of anesthesia    COVID-19    07/2020   Family history of adverse reaction to anesthesia    SISTER-HARD TIME WAKING UP   Family history of colorectal cancer    Family history of liver cancer    HSV-1 (herpes simplex virus 1) infection    Medical history non-contributory    PONV (postoperative nausea and vomiting) 09/2016   X 1 AFTER BTL   Positive Lyme disease serology     Past Surgical History:  Procedure Laterality Date   ABDOMINAL HYSTERECTOMY     05/2017 Dr. Lake    APPENDECTOMY     2010   CERVICAL CONE BIOPSY  03/05/2017   [facility]   CERVICAL  CONIZATION W/BX N/A 03/05/2017   Procedure: CONIZATION CERVIX WITH BIOPSY;  Surgeon: Glory Lake, MD;  Location: [facility] ORS;  Service: Gynecology;  Laterality: N/A;   COLONOSCOPY WITH PROPOFOL  N/A 10/05/2019   Procedure: COLONOSCOPY WITH PROPOFOL ;  Surgeon: Toledo, Ladell POUR, MD;  Location: [facility] ENDOSCOPY;  Service: Gastroenterology;  Laterality: N/A;   CYSTOSCOPY N/A 05/07/2017   Procedure: CYSTOSCOPY;  Surgeon: Lake Glory, MD;  Location: [facility] ORS;  Service: Gynecology;  Laterality: N/A;   LAPAROSCOPIC HYSTERECTOMY Bilateral 05/07/2017   Procedure: HYSTERECTOMY TOTAL LAPAROSCOPIC BILATERAL SALPINGECTOMY;  Surgeon: Lake Glory, MD;  Location: [facility] ORS;  Service: Gynecology;  Laterality: Bilateral;   LAPAROSCOPIC TUBAL LIGATION Bilateral 09/25/2016   Procedure: LAPAROSCOPIC TUBAL LIGATION;  Surgeon: Glory Lake, MD;  Location: [facility] ORS;  Service: Gynecology;  Laterality: Bilateral;   TONSILLECTOMY     TUBAL LIGATION  09/2016   WISDOM TOOTH EXTRACTION      Family History  Problem Relation Age of Onset   Cancer Mother        dx 05/2021 age 62   Hyperlipidemia Mother    Hypertension Mother    Breast cancer Mother 1   Rectal cancer Father 80       metastasized to lung   Pulmonary embolism Father    Healthy Daughter    Healthy Daughter    COPD Maternal Aunt    Liver cancer Maternal Aunt 33  Colon cancer Paternal Great-grandfather    Colon cancer Other        paternal great-uncle   Colon cancer Other        paternal great-uncle   Colon cancer Other        paternal first cousin once removed    Social History   Socioeconomic History   Marital status: Married    Spouse name: Not on file   Number of children: Not on file   Years of education: Not on file   Highest education level: 12th grade  Occupational History   Not on file  Tobacco Use   Smoking status: Never   Smokeless tobacco: Never  Vaping Use   Vaping status: Never Used  Substance and Sexual Activity    Alcohol use: No   Drug use: No   Sexual activity: Yes    Birth control/protection: Surgical    Comment: BTL  Other Topics Concern   Not on file  Social History Narrative   Married 2 kids girls    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[facility] , 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    No drinking smoking    No guns, wears seatbelts    Safe in relationship    Social Drivers of Health   Financial Resource Strain: Low Risk  (07/07/2024)   Overall Financial Resource Strain (CARDIA)    Difficulty of Paying Living Expenses: Not very hard  Food Insecurity: No Food Insecurity (07/07/2024)   Hunger Vital Sign    Worried About Running Out of Food in the Last Year: Never true    Ran Out of Food in the Last Year: Never true  Transportation Needs: No Transportation Needs (07/07/2024)   PRAPARE - Administrator, Civil Service (Medical): No    Lack of Transportation (Non-Medical): No  Physical Activity: Sufficiently Active (07/07/2024)   Exercise Vital Sign    Days of Exercise per Week: 5 days    Minutes of Exercise per Session: 30 min  Stress: No Stress Concern Present (07/07/2024)   [employer] of Occupational Health - Occupational Stress Questionnaire    Feeling of Stress: Not at all  Social Connections: Moderately Integrated (07/07/2024)   Social Connection and Isolation Panel    Frequency of Communication with Friends and Family: More than three times a week    Frequency of Social Gatherings with Friends and Family: Three times a week    Attends Religious Services: 1 to 4 times per year    Active Member of Clubs or Organizations: No    Attends Engineer, structural: Not on file    Marital Status: Married  Catering manager Violence: Not on file     Outpatient Medications Prior to Visit  Medication Sig Dispense Refill   mycophenolate  (CELLCEPT ) 500 MG tablet Take 2 tablets (1,000 mg total) by mouth 2 times daily. Taken by mouth as directed 360 tablet 5   mycophenolate  (CELLCEPT ) 500 MG tablet Take 2  tablets (1,000 mg total) by mouth 2 times daily. Taken by mouth as directed 360 tablet 5   mycophenolate  (CELLCEPT ) 500 MG tablet Take 2 tablets (1,000 mg total) by mouth 2 times daily. Taken by mouth as directed 360 tablet 5   mycophenolate  (CELLCEPT ) 500 MG tablet Take 2 tablets (1,000 mg total) by mouth 2 (two) times daily. 360 tablet 5   mycophenolate  (CELLCEPT ) 500 MG tablet Take 2 tablets (1,000 mg total) by mouth 2 (two) times daily. 360 tablet 5   mycophenolate  (CELLCEPT ) 500 MG tablet  Take 2 tablets (1,000 mg total) by mouth 2 (two) times daily. 360 tablet 5   prednisoLONE  acetate (PRED FORTE ) 1 % ophthalmic suspension SMARTSIG:In Eye(s)     doxycycline  (VIBRA -TABS) 100 MG tablet Take 1 tablet (100 mg total) by mouth 2 (two) times daily. 20 tablet 0   No facility-administered medications prior to visit.    Allergies  Allergen Reactions   Tetracaine Other (See Comments)    Extreme eye redness   Sulfamethoxazole-Trimethoprim Rash    ROS Review of Systems Negative unless indicated in HPI.    Objective:    Physical Exam Constitutional:      Appearance: Normal appearance.  HENT:     Right Ear: Tympanic membrane normal.     Left Ear: Tympanic membrane normal.     Mouth/Throat:     Mouth: Mucous membranes are moist.  Eyes:     Conjunctiva/sclera: Conjunctivae normal.     Pupils: Pupils are equal, round, and reactive to light.  Cardiovascular:     Rate and Rhythm: Normal rate and regular rhythm.     Pulses: Normal pulses.     Heart sounds: Normal heart sounds.  Pulmonary:     Effort: Pulmonary effort is normal.     Breath sounds: Normal breath sounds.  Abdominal:     General: Bowel sounds are normal.     Palpations: Abdomen is soft.  Musculoskeletal:     Cervical back: Normal range of motion. No tenderness.  Skin:    General: Skin is warm.     Findings: No bruising or rash (circular rash on the right thighwith slight surrounding erythema).  Neurological:      General: No focal deficit present.     Mental Status: She is alert and oriented to person, place, and time. Mental status is at baseline.  Psychiatric:        Mood and Affect: Mood normal.        Behavior: Behavior normal.        Thought Content: Thought content normal.        Judgment: Judgment normal.     BP 118/76   Pulse 82   Temp 97.9 F (36.6 C)   Ht 5' 7 (1.702 m)   Wt 153 lb 9.6 oz (69.7 kg)   LMP 02/04/2017   SpO2 98%   BMI 24.06 kg/m  Wt Readings from Last 3 Encounters:  07/07/24 153 lb 9.6 oz (69.7 kg)  04/10/23 150 lb 3.2 oz (68.1 kg)  01/03/23 148 lb 12.8 oz (67.5 kg)     Health Maintenance  Topic Date Due   Pneumococcal Vaccine (1 of 2 - PCV) Never done   Hepatitis B Vaccines 19-59 Average Risk (1 of 3 - 19+ 3-dose series) Never done   HPV VACCINES (1 - 3-dose SCDM series) Never done   COVID-19 Vaccine (5 - 2025-26 season) 07/06/2024   Colonoscopy  10/04/2024   Influenza Vaccine  02/02/2025 (Originally 06/05/2024)   DTaP/Tdap/Td (2 - Td or Tdap) 01/03/2026   Cervical Cancer Screening (HPV/Pap Cotest)  12/28/2026   Hepatitis C Screening  Completed   HIV Screening  Completed   Meningococcal B Vaccine  Aged Out       Topic Date Due   Hepatitis B Vaccines 19-59 Average Risk (1 of 3 - 19+ 3-dose series) Never done   HPV VACCINES (1 - 3-dose SCDM series) Never done    Lab Results  Component Value Date   TSH 1.78 07/08/2024   Lab Results  Component Value Date   WBC 4.5 07/31/2024   HGB 13.6 07/31/2024   HCT 41.4 07/31/2024   MCV 89.4 07/31/2024   PLT 197 07/31/2024   Lab Results  Component Value Date   NA 140 07/31/2024   K 3.7 07/31/2024   CO2 26 07/31/2024   GLUCOSE 83 07/31/2024   BUN 11 07/31/2024   CREATININE 0.73 07/31/2024   BILITOT 1.8 (H) 07/31/2024   ALKPHOS 43 07/31/2024   AST 14 (L) 07/31/2024   ALT 13 07/31/2024   PROT 6.5 07/31/2024   ALBUMIN 4.2 07/31/2024   CALCIUM 9.2 07/31/2024   ANIONGAP 9 07/31/2024   GFR 102.99  07/08/2024   Lab Results  Component Value Date   CHOL 186 07/08/2024   Lab Results  Component Value Date   HDL 63.60 07/08/2024   Lab Results  Component Value Date   LDLCALC 109 (H) 07/08/2024   Lab Results  Component Value Date   TRIG 69.0 07/08/2024   Lab Results  Component Value Date   CHOLHDL 3 07/08/2024   No results found for: HGBA1C    Assessment & Plan:  Annual physical exam Assessment & Plan: Encouraged patient to consume a balanced diet and regular exercise regimen. Advised to see an eye doctor and dentist annually.    Orders: -     CBC with Differential/Platelet; Future -     Comprehensive metabolic panel with GFR; Future -     Lipid panel; Future -     TSH; Future  Other orders -     Clotrimazole -Betamethasone ; Apply 1 Application topically daily.  Dispense: 30 g; Refill: 0    Follow-up: Return for fasting labs.   Davonna Ertl, NP

## 2024-07-08 ENCOUNTER — Other Ambulatory Visit (INDEPENDENT_AMBULATORY_CARE_PROVIDER_SITE_OTHER)

## 2024-07-08 DIAGNOSIS — Z0000 Encounter for general adult medical examination without abnormal findings: Secondary | ICD-10-CM | POA: Diagnosis not present

## 2024-07-08 DIAGNOSIS — Z13220 Encounter for screening for lipoid disorders: Secondary | ICD-10-CM

## 2024-07-08 LAB — LIPID PANEL
Cholesterol: 186 mg/dL (ref 0–200)
HDL: 63.6 mg/dL (ref >39.00)
LDL Cholesterol: 109 mg/dL — ABNORMAL HIGH (ref 0–99)
NonHDL: 122.72
Total CHOL/HDL Ratio: 3
Triglycerides: 69 mg/dL (ref 0.0–149.0)
VLDL: 13.8 mg/dL (ref 0.0–40.0)

## 2024-07-08 LAB — CBC WITH DIFFERENTIAL/PLATELET
Basophils Absolute: 0 K/uL (ref 0.0–0.1)
Basophils Relative: 0.5 % (ref 0.0–3.0)
Eosinophils Absolute: 0.1 K/uL (ref 0.0–0.7)
Eosinophils Relative: 1.2 % (ref 0.0–5.0)
HCT: 42.8 % (ref 36.0–46.0)
Hemoglobin: 14.2 g/dL (ref 12.0–15.0)
Lymphocytes Relative: 43.3 % (ref 12.0–46.0)
Lymphs Abs: 2 K/uL (ref 0.7–4.0)
MCHC: 33.1 g/dL (ref 30.0–36.0)
MCV: 87.9 fl (ref 78.0–100.0)
Monocytes Absolute: 0.4 K/uL (ref 0.1–1.0)
Monocytes Relative: 8.4 % (ref 3.0–12.0)
Neutro Abs: 2.2 K/uL (ref 1.4–7.7)
Neutrophils Relative %: 46.6 % (ref 43.0–77.0)
Platelets: 200 K/uL (ref 150.0–400.0)
RBC: 4.87 Mil/uL (ref 3.87–5.11)
RDW: 13.4 % (ref 11.5–15.5)
WBC: 4.7 K/uL (ref 4.0–10.5)

## 2024-07-08 LAB — COMPREHENSIVE METABOLIC PANEL WITH GFR
ALT: 11 U/L (ref 0–35)
AST: 12 U/L (ref 0–37)
Albumin: 4.6 g/dL (ref 3.5–5.2)
Alkaline Phosphatase: 51 U/L (ref 39–117)
BUN: 11 mg/dL (ref 6–23)
CO2: 29 meq/L (ref 19–32)
Calcium: 9.7 mg/dL (ref 8.4–10.5)
Chloride: 103 meq/L (ref 96–112)
Creatinine, Ser: 0.76 mg/dL (ref 0.40–1.20)
GFR: 102.99 mL/min (ref >60.00)
Glucose, Bld: 76 mg/dL (ref 70–99)
Potassium: 4.2 meq/L (ref 3.5–5.1)
Sodium: 139 meq/L (ref 135–145)
Total Bilirubin: 1.7 mg/dL — ABNORMAL HIGH (ref 0.2–1.2)
Total Protein: 6.8 g/dL (ref 6.0–8.3)

## 2024-07-08 LAB — TSH: TSH: 1.78 u[IU]/mL (ref 0.35–5.50)

## 2024-07-13 ENCOUNTER — Encounter: Payer: Self-pay | Admitting: Nurse Practitioner

## 2024-07-27 ENCOUNTER — Ambulatory Visit: Payer: Self-pay | Admitting: Nurse Practitioner

## 2024-07-27 NOTE — Assessment & Plan Note (Signed)
 Encouraged patient to consume a balanced diet and regular exercise regimen. Advised to see an eye doctor and dentist annually.

## 2024-07-31 ENCOUNTER — Other Ambulatory Visit
Admission: RE | Admit: 2024-07-31 | Discharge: 2024-07-31 | Disposition: A | Discharge status: Home / Self Care | Attending: Ophthalmology | Admitting: Ophthalmology

## 2024-07-31 DIAGNOSIS — Z5181 Encounter for therapeutic drug level monitoring: Secondary | ICD-10-CM | POA: Insufficient documentation

## 2024-07-31 DIAGNOSIS — H30033 Focal chorioretinal inflammation, peripheral, bilateral: Secondary | ICD-10-CM | POA: Diagnosis not present

## 2024-07-31 LAB — CBC WITH DIFFERENTIAL/PLATELET
Abs Immature Granulocytes: 0.01 K/uL (ref 0.00–0.07)
Basophils Absolute: 0 K/uL (ref 0.0–0.1)
Basophils Relative: 1 %
Eosinophils Absolute: 0.1 K/uL (ref 0.0–0.5)
Eosinophils Relative: 1 %
HCT: 41.4 % (ref 36.0–46.0)
Hemoglobin: 13.6 g/dL (ref 12.0–15.0)
Immature Granulocytes: 0 %
Lymphocytes Relative: 44 %
Lymphs Abs: 2 K/uL (ref 0.7–4.0)
MCH: 29.4 pg (ref 26.0–34.0)
MCHC: 32.9 g/dL (ref 30.0–36.0)
MCV: 89.4 fL (ref 80.0–100.0)
Monocytes Absolute: 0.3 K/uL (ref 0.1–1.0)
Monocytes Relative: 7 %
Neutro Abs: 2.1 K/uL (ref 1.7–7.7)
Neutrophils Relative %: 47 %
Platelets: 197 K/uL (ref 150–400)
RBC: 4.63 MIL/uL (ref 3.87–5.11)
RDW: 12.5 % (ref 11.5–15.5)
WBC: 4.5 K/uL (ref 4.0–10.5)
nRBC: 0 % (ref 0.0–0.2)

## 2024-07-31 LAB — COMPREHENSIVE METABOLIC PANEL WITH GFR
ALT: 13 U/L (ref 0–44)
AST: 14 U/L — ABNORMAL LOW (ref 15–41)
Albumin: 4.2 g/dL (ref 3.5–5.0)
Alkaline Phosphatase: 43 U/L (ref 38–126)
Anion gap: 9 (ref 5–15)
BUN: 11 mg/dL (ref 6–20)
CO2: 26 mmol/L (ref 22–32)
Calcium: 9.2 mg/dL (ref 8.9–10.3)
Chloride: 105 mmol/L (ref 98–111)
Creatinine, Ser: 0.73 mg/dL (ref 0.44–1.00)
GFR, Estimated: >60 mL/min (ref >60)
Glucose, Bld: 83 mg/dL (ref 70–99)
Potassium: 3.7 mmol/L (ref 3.5–5.1)
Sodium: 140 mmol/L (ref 135–145)
Total Bilirubin: 1.8 mg/dL — ABNORMAL HIGH (ref 0.0–1.2)
Total Protein: 6.5 g/dL (ref 6.5–8.1)

## 2024-07-31 MED ORDER — CLOTRIMAZOLE-BETAMETHASONE 1-0.05 % EX CREA: 1.0000 | TOPICAL_CREAM | Freq: Every day | CUTANEOUS | 0 refills | Status: AC

## 2024-08-04 ENCOUNTER — Other Ambulatory Visit: Payer: Self-pay

## 2024-08-04 DIAGNOSIS — H30033 Focal chorioretinal inflammation, peripheral, bilateral: Secondary | ICD-10-CM | POA: Diagnosis not present

## 2024-08-04 DIAGNOSIS — Z79899 Other long term (current) drug therapy: Secondary | ICD-10-CM | POA: Diagnosis not present

## 2024-08-04 DIAGNOSIS — H3581 Retinal edema: Secondary | ICD-10-CM | POA: Diagnosis not present

## 2024-08-04 MED ORDER — MYCOPHENOLATE MOFETIL 500 MG PO TABS: 1000.0000 mg | ORAL_TABLET | Freq: Two times a day (BID) | ORAL | 5 refills | Status: AC

## 2024-08-04 MED ORDER — MYCOPHENOLATE MOFETIL 500 MG PO TABS
1000.0000 mg | ORAL_TABLET | Freq: Two times a day (BID) | ORAL | 5 refills | Status: AC
  Filled 2024-08-04: qty 360, 90d supply, fill #0
  Filled 2024-09-22: qty 360, 90d supply, fill #1
  Filled 2024-12-07: qty 360, 90d supply, fill #0

## 2024-08-14 ENCOUNTER — Telehealth: Admitting: Family Medicine

## 2024-08-14 DIAGNOSIS — B9689 Other specified bacterial agents as the cause of diseases classified elsewhere: Secondary | ICD-10-CM | POA: Diagnosis not present

## 2024-08-14 DIAGNOSIS — J208 Acute bronchitis due to other specified organisms: Secondary | ICD-10-CM

## 2024-08-14 DIAGNOSIS — J028 Acute pharyngitis due to other specified organisms: Secondary | ICD-10-CM

## 2024-08-14 MED ORDER — BENZONATATE 200 MG PO CAPS: 200.0000 mg | ORAL_CAPSULE | Freq: Two times a day (BID) | ORAL | 0 refills | Status: AC | PRN

## 2024-08-14 MED ORDER — DOXYCYCLINE HYCLATE 100 MG PO TABS: 100.0000 mg | ORAL_TABLET | Freq: Two times a day (BID) | ORAL | 0 refills | Status: AC

## 2024-08-14 NOTE — Progress Notes (Signed)
 We are sorry that you are not feeling well.  Here is how we plan to help!  Based on your presentation I believe you most likely have A cough due to bacteria.  When patients have a fever and a productive cough with a change in color or increased sputum production, we are concerned about bacterial bronchitis.  If left untreated it can progress to pneumonia.  If your symptoms do not improve with your treatment plan it is important that you contact your provider.   I have prescribed Doxycycline  100 mg twice a day for 7 days     In addition you may use A prescription cough medication called Tessalon  Perles 100mg . You may take 1-2 capsules every 8 hours as needed for your cough.   From your responses in the eVisit questionnaire you describe inflammation in the upper respiratory tract which is causing a significant cough.  This is commonly called Bronchitis and has four common causes:   Allergies Viral Infections Acid Reflux Bacterial Infection Allergies, viruses and acid reflux are treated by controlling symptoms or eliminating the cause. An example might be a cough caused by taking certain blood pressure medications. You stop the cough by changing the medication. Another example might be a cough caused by acid reflux. Controlling the reflux helps control the cough.  USE OF BRONCHODILATOR (RESCUE) INHALERS: There is a risk from using your bronchodilator too frequently.  The risk is that over-reliance on a medication which only relaxes the muscles surrounding the breathing tubes can reduce the effectiveness of medications prescribed to reduce swelling and congestion of the tubes themselves.  Although you feel brief relief from the bronchodilator inhaler, your asthma may actually be worsening with the tubes becoming more swollen and filled with mucus.  This can delay other crucial treatments, such as oral steroid medications. If you need to use a bronchodilator inhaler daily, several times per day, you  should discuss this with your provider.  There are probably better treatments that could be used to keep your asthma under control.     HOME CARE Only take medications as instructed by your medical team. Complete the entire course of an antibiotic. Drink plenty of fluids and get plenty of rest. Avoid close contacts especially the very young and the elderly Cover your mouth if you cough or cough into your sleeve. Always remember to wash your hands A steam or ultrasonic humidifier can help congestion.   GET HELP RIGHT AWAY IF: You develop worsening fever. You become short of breath You cough up blood. Your symptoms persist after you have completed your treatment plan MAKE SURE YOU  Understand these instructions. Will watch your condition. Will get help right away if you are not doing well or get worse.  Your e-visit answers were reviewed by a board certified advanced clinical practitioner to complete your personal care plan.  Depending on the condition, your plan could have included both over the counter or prescription medications. If there is a problem please reply  once you have received a response from your provider. Your safety is important to us .  If you have drug allergies check your prescription carefully.    You can use MyChart to ask questions about today's visit, request a non-urgent call back, or ask for a work or school excuse for 24 hours related to this e-Visit. If it has been greater than 24 hours you will need to follow up with your provider, or enter a new e-Visit to address those concerns. You  will get an e-mail in the next two days asking about your experience.  I hope that your e-visit has been valuable and will speed your recovery. Thank you for using e-visits.   I have spent 5 minutes in review of e-visit questionnaire, review and updating patient chart, medical decision making and response to patient.   Nayda Riesen, FNP

## 2024-09-22 ENCOUNTER — Other Ambulatory Visit: Payer: Self-pay

## 2024-09-22 ENCOUNTER — Other Ambulatory Visit (HOSPITAL_COMMUNITY): Payer: Self-pay

## 2024-09-23 ENCOUNTER — Other Ambulatory Visit: Payer: Self-pay

## 2024-09-24 ENCOUNTER — Other Ambulatory Visit: Payer: Self-pay

## 2024-09-28 ENCOUNTER — Other Ambulatory Visit: Payer: Self-pay

## 2024-10-05 ENCOUNTER — Ambulatory Visit: Payer: Self-pay

## 2024-10-05 ENCOUNTER — Other Ambulatory Visit: Payer: Self-pay

## 2024-10-05 ENCOUNTER — Ambulatory Visit (INDEPENDENT_AMBULATORY_CARE_PROVIDER_SITE_OTHER): Admitting: Internal Medicine

## 2024-10-05 VITALS — BP 122/78 | HR 110 | Temp 97.5°F | Ht 67.0 in | Wt 147.4 lb

## 2024-10-05 DIAGNOSIS — L02211 Cutaneous abscess of abdominal wall: Secondary | ICD-10-CM

## 2024-10-05 MED ORDER — DOXYCYCLINE HYCLATE 100 MG PO TABS
100.0000 mg | ORAL_TABLET | Freq: Two times a day (BID) | ORAL | 0 refills | Status: AC
  Filled 2024-10-05: qty 10, 5d supply, fill #0

## 2024-10-05 NOTE — Assessment & Plan Note (Signed)
-   Patient with painful lump below her umbilicus over the last week.  Patient had a prior history of an abscess in 2021 in the same area that required an I&D -Patient states that she has been using warm compresses but this morning noted redness around the site as well as increased warmth -On exam, patient has a 1 cm x 1 cm abdominal wall abscess with tenderness to palpation and fluctuation and surrounding erythema as well as increased local warmth -Patient will go to urgent care to get an I&D done -She would like referral to surgery (Dr. Rodolph) to assess the cause for why she is getting recurrent abscesses in the same area -Will prescribe dicyclomine 100 mg twice a day for 5 days given the surrounding cellulitis -No further workup at this time

## 2024-10-05 NOTE — Patient Instructions (Signed)
  VISIT SUMMARY: You came in today because of a recurrent abscess that has developed in the same location as a previous one treated in 2021. The area is red, warm, and tender, and has not improved with heat compresses.  YOUR PLAN: -RECURRENT CUTANEOUS ABSCESS: A recurrent cutaneous abscess is a reappearance of a collection of pus under the skin, often due to infection. You have been prescribed Doxycycline  to help control the infection. You are referred to Dr. Hayes for a surgical evaluation. If you cannot wait for the surgical appointment and the abscess worsens, please visit urgent care for incision and drainage. It is important to keep your appointment with Dr. Hayes for further evaluation.  INSTRUCTIONS: Please follow up with Dr. Hayes for a surgical evaluation of your recurrent abscess. If the abscess worsens and you cannot wait for the appointment, visit urgent care for incision and drainage.                      Contains text generated by [employer].                                 Contains text generated by [employer].

## 2024-10-05 NOTE — Progress Notes (Signed)
 Acute Office Visit  Subjective:     Patient ID: Stefanie Gomez, female    DOB: [birth year], 33 y.o.   MRN: [id number]  Chief Complaint  Patient presents with   Acute Visit    Abdominal swelling/ lump at waistline near belly button x 1 week Hard & tender  Now it is Red Pain 5-6/10    Discussed the use of AI scribe software for clinical note transcription with the patient, who gave verbal consent to proceed.  History of Present Illness Stefanie Gomez is a 33 year old female who presents with a recurrent abscess.  Localized abscess recurrence - Hard lump developed on September 28, 2024 in the same location as a previous abscess treated with incision and drainage in 2021. - Area has become increasingly red, tender, and warm, especially with friction or contact. - Tenderness has intensified since onset. - Redness and warmth have progressively increased. - No pain reported in other areas. - Heat compresses applied without improvement in lump size or symptoms.    Review of Systems  Constitutional: Negative.  Negative for chills and fever.  HENT: Negative.    Respiratory: Negative.    Cardiovascular: Negative.   Gastrointestinal: Negative.   Musculoskeletal: Negative.   Skin:        Redness and swelling near the umbilicus which has progressively worsened despite warm compresses  Neurological: Negative.   Psychiatric/Behavioral: Negative.          Objective:    BP 122/78   Pulse (!) 110   Temp (!) 97.5 F (36.4 C)   Ht 5' 7 (1.702 m)   Wt 147 lb 6.4 oz (66.9 kg)   LMP 02/04/2017   SpO2 94%   BMI 23.09 kg/m    Physical Exam Constitutional:      Appearance: Normal appearance.  HENT:     Head: Normocephalic and atraumatic.  Cardiovascular:     Rate and Rhythm: Normal rate and regular rhythm.     Heart sounds: Normal heart sounds.  Pulmonary:     Breath sounds: Normal breath sounds. No wheezing or rales.  Abdominal:     General: There is no distension.      Palpations: Abdomen is soft.     Tenderness: There is no abdominal tenderness.     Comments: Abscess noted right below the umbilicus with surrounding erythema.  Abscess approximately 1 cm x 1 cm with fluctuation and increased warmth  Neurological:     Mental Status: She is alert.  Psychiatric:        Mood and Affect: Mood normal.        Behavior: Behavior normal.     No results found for any visits on 10/05/24.      Assessment & Plan:   Problem List Items Addressed This Visit       Other   Abscess of abdominal wall - Primary   - Patient with painful lump below her umbilicus over the last week.  Patient had a prior history of an abscess in 2021 in the same area that required an I&D -Patient states that she has been using warm compresses but this morning noted redness around the site as well as increased warmth -On exam, patient has a 1 cm x 1 cm abdominal wall abscess with tenderness to palpation and fluctuation and surrounding erythema as well as increased local warmth -Patient will go to urgent care to get an I&D done -She would like referral to surgery (Dr. Rodolph) to  assess the cause for why she is getting recurrent abscesses in the same area -Will prescribe dicyclomine 100 mg twice a day for 5 days given the surrounding cellulitis -No further workup at this time      Relevant Medications   doxycycline  (VIBRA -TABS) 100 MG tablet   Other Relevant Orders   Ambulatory referral to General Surgery    Meds ordered this encounter  Medications   doxycycline  (VIBRA -TABS) 100 MG tablet    Sig: Take 1 tablet (100 mg total) by mouth 2 (two) times daily.    Dispense:  10 tablet    Refill:  0    No follow-ups on file.  Argusta Mcgann, MD

## 2024-10-05 NOTE — Telephone Encounter (Signed)
 FYI Only or Action Required?: FYI only for provider: appointment scheduled on 10/05/24.  Patient was last seen in primary care on 07/07/2024 by Vincente Saber, NP.  Called Nurse Triage reporting Abscess.  Symptoms began a week ago.  Interventions attempted: OTC medications: Tylenol .  Symptoms are: gradually worsening.  Triage Disposition: See Physician Within 24 Hours  Patient/caregiver understands and will follow disposition?: Yes   Copied from CRM #[id number]. Topic: Clinical - Red Word Triage >> Oct 05, 2024  8:36 AM Mesmerise C wrote: [facility] that prompted transfer to Nurse Triage: Patient had an abscess on her stomach 1-2 years, states last Monday noticed it getting red and being tender Reason for Disposition  [1] Swelling is painful to touch AND [2] no fever  Answer Assessment - Initial Assessment Questions 1. APPEARANCE of SWELLING: What does it look like?     Hard lump 2. SIZE: How large is the swelling? (e.g., inches, cm; or compare to size of pinhead, tip of pen, eraser, coin, pea, grape, ping pong ball)      Grape 3. LOCATION: Where is the swelling located?     Next to navel 4. ONSET: When did the swelling start?     X 1 week 5. COLOR: What color is it? Is there more than one color?     Red 6. PAIN: Is there any pain? If Yes, ask: How bad is the pain? (Scale 1-10; or mild, moderate, severe)       6/10 7. ITCH: Does it itch? If Yes, ask: How bad is the itch?      None 8. CAUSE: What do you think caused the swelling?     Unknown, history of abscess  9 OTHER SYMPTOMS: Do you have any other symptoms? (e.g., fever)     None  Protocols used: Skin Lump or Localized Swelling-A-AH

## 2024-10-06 DIAGNOSIS — L720 Epidermal cyst: Secondary | ICD-10-CM | POA: Diagnosis not present

## 2024-10-06 DIAGNOSIS — L02216 Cutaneous abscess of umbilicus: Secondary | ICD-10-CM | POA: Diagnosis not present

## 2024-10-06 DIAGNOSIS — L089 Local infection of the skin and subcutaneous tissue, unspecified: Secondary | ICD-10-CM | POA: Diagnosis not present

## 2024-12-07 ENCOUNTER — Telehealth: Admitting: Physician Assistant

## 2024-12-07 ENCOUNTER — Other Ambulatory Visit (HOSPITAL_COMMUNITY): Payer: Self-pay

## 2024-12-07 ENCOUNTER — Other Ambulatory Visit: Payer: Self-pay

## 2024-12-07 DIAGNOSIS — B9689 Other specified bacterial agents as the cause of diseases classified elsewhere: Secondary | ICD-10-CM

## 2024-12-07 MED ORDER — AMOXICILLIN-POT CLAVULANATE 875-125 MG PO TABS
1.0000 | ORAL_TABLET | Freq: Two times a day (BID) | ORAL | 0 refills | Status: AC
  Filled 2024-12-07: qty 14, 7d supply, fill #0

## 2024-12-07 NOTE — Progress Notes (Signed)
 E-Visit for Sinus Problems  We are sorry that you are not feeling well.  Here is how we plan to help!  Based on what you have shared with me it looks like you have sinusitis.  Sinusitis is inflammation and infection in the sinus cavities of the head.  Based on your presentation I believe you most likely have Acute Bacterial Sinusitis.  This is an infection caused by bacteria and is treated with antibiotics. I have prescribed Augmentin  875mg /125mg  one tablet twice daily with food, for 7 days.   You may use an oral decongestant such as Mucinex D or if you have glaucoma or high blood pressure use plain Mucinex. Saline nasal spray help and can safely be used as often as needed for congestion. Try using saline irrigation, such as with a neti pot, several times a day while you are sick. Many neti pots come with salt packets premeasured to use to make saline. If you use your own salt, make sure it is kosher salt or sea salt (don't use table salt as it has iodine in it and you don't need that in your nose). Use distilled water to make saline. If you mix your own saline using your own salt, the recipe is 1/4 teaspoon salt in 1 cup warm water. Using saline irrigation can help prevent and treat sinus infections.   If you develop worsening sinus pain, fever or notice severe headache and vision changes, or if symptoms are not better after completion of antibiotic, please schedule an appointment with a health care provider.    Sinus infections are not as easily transmitted as other respiratory infection, however we still recommend that you avoid close contact with loved ones, especially the very young and elderly.  Remember to wash your hands thoroughly throughout the day as this is the number one way to prevent the spread of infection!  Home Care: Only take medications as instructed by your medical team. Complete the entire course of an antibiotic. Do not take these medications with alcohol. A steam or  ultrasonic humidifier can help congestion.  You can place a towel over your head and breathe in the steam from hot water coming from a faucet. Avoid close contacts especially the very young and the elderly. Cover your mouth when you cough or sneeze. Always remember to wash your hands.  Get Help Right Away If: You develop worsening fever or sinus pain. You develop a severe head ache or visual changes. Your symptoms persist after you have completed your treatment plan.  Make sure you Understand these instructions. Will watch your condition. Will get help right away if you are not doing well or get worse.  Your e-visit answers were reviewed by a board certified advanced clinical practitioner to complete your personal care plan.  Depending on the condition, your plan could have included both over the counter or prescription medications.  If there is a problem please reply  once you have received a response from your provider.  Your safety is important to us .  If you have drug allergies check your prescription carefully.    You can use MyChart to ask questions about today's visit, request a non-urgent call back, or ask for a work or school excuse for 24 hours related to this e-Visit. If it has been greater than 24 hours you will need to follow up with your provider, or enter a new e-Visit to address those concerns.  You will get an e-mail in the next two days asking  about your experience.  I hope that your e-visit has been valuable and will speed your recovery. Thank you for using e-visits.  I have spent 5 minutes in review of e-visit questionnaire, review and updating patient chart, medical decision making and response to patient.   Jon Belt, PhD, FNP-BC

## 2024-12-08 ENCOUNTER — Other Ambulatory Visit: Payer: Self-pay
